# Patient Record
Sex: Female | Born: 1975 | Race: White | Hispanic: No | Marital: Married | Smoking: Never smoker
Health system: Southern US, Community
[De-identification: ages and names within clinical notes are randomized; demographics above are authoritative.]

## PROBLEM LIST (undated history)

## (undated) DIAGNOSIS — J45909 Unspecified asthma, uncomplicated: Secondary | ICD-10-CM

## (undated) DIAGNOSIS — R0600 Dyspnea, unspecified: Secondary | ICD-10-CM

## (undated) DIAGNOSIS — Z789 Other specified health status: Secondary | ICD-10-CM

## (undated) HISTORY — PX: NO PAST SURGERIES: SHX2092

## (undated) HISTORY — DX: Other specified health status: Z78.9

---

## 2013-03-02 DIAGNOSIS — Z1371 Encounter for nonprocreative screening for genetic disease carrier status: Secondary | ICD-10-CM

## 2013-03-02 HISTORY — DX: Encounter for nonprocreative screening for genetic disease carrier status: Z13.71

## 2013-09-29 LAB — HM PAP SMEAR: HM PAP: NORMAL

## 2013-10-11 ENCOUNTER — Ambulatory Visit: Payer: Self-pay | Admitting: Nurse Practitioner

## 2014-04-16 ENCOUNTER — Ambulatory Visit: Payer: Self-pay | Admitting: Nurse Practitioner

## 2014-09-28 ENCOUNTER — Other Ambulatory Visit: Payer: Self-pay | Admitting: Nurse Practitioner

## 2014-09-28 DIAGNOSIS — N63 Unspecified lump in unspecified breast: Secondary | ICD-10-CM

## 2014-10-08 ENCOUNTER — Other Ambulatory Visit: Payer: Self-pay

## 2014-10-08 ENCOUNTER — Ambulatory Visit: Payer: Self-pay

## 2014-10-17 ENCOUNTER — Ambulatory Visit: Payer: Self-pay

## 2014-10-17 ENCOUNTER — Ambulatory Visit
Admission: RE | Admit: 2014-10-17 | Discharge: 2014-10-17 | Disposition: A | Discharge status: Home / Self Care | Payer: BC Managed Care – PPO | Source: Ambulatory Visit | Attending: Nurse Practitioner | Admitting: Nurse Practitioner

## 2014-10-17 DIAGNOSIS — N63 Unspecified lump in unspecified breast: Secondary | ICD-10-CM

## 2015-10-01 ENCOUNTER — Other Ambulatory Visit: Payer: Self-pay | Admitting: Obstetrics and Gynecology

## 2015-10-21 ENCOUNTER — Other Ambulatory Visit: Payer: Self-pay | Admitting: Obstetrics and Gynecology

## 2015-10-21 DIAGNOSIS — Z1231 Encounter for screening mammogram for malignant neoplasm of breast: Secondary | ICD-10-CM

## 2015-11-12 ENCOUNTER — Ambulatory Visit
Admission: RE | Admit: 2015-11-12 | Discharge: 2015-11-12 | Disposition: A | Discharge status: Home / Self Care | Payer: BC Managed Care – PPO | Source: Ambulatory Visit | Attending: Obstetrics and Gynecology | Admitting: Obstetrics and Gynecology

## 2015-11-12 ENCOUNTER — Other Ambulatory Visit: Payer: Self-pay | Admitting: Obstetrics and Gynecology

## 2015-11-12 DIAGNOSIS — Z1231 Encounter for screening mammogram for malignant neoplasm of breast: Secondary | ICD-10-CM

## 2017-04-02 ENCOUNTER — Other Ambulatory Visit: Payer: Self-pay | Admitting: Obstetrics and Gynecology

## 2017-04-02 ENCOUNTER — Encounter: Payer: Self-pay | Admitting: Obstetrics and Gynecology

## 2017-04-02 ENCOUNTER — Ambulatory Visit (INDEPENDENT_AMBULATORY_CARE_PROVIDER_SITE_OTHER): Payer: BC Managed Care – PPO | Admitting: Obstetrics and Gynecology

## 2017-04-02 VITALS — BP 128/88 | Ht 63.0 in | Wt 270.0 lb

## 2017-04-02 DIAGNOSIS — Z3041 Encounter for surveillance of contraceptive pills: Secondary | ICD-10-CM | POA: Diagnosis not present

## 2017-04-02 DIAGNOSIS — Z124 Encounter for screening for malignant neoplasm of cervix: Secondary | ICD-10-CM

## 2017-04-02 DIAGNOSIS — N921 Excessive and frequent menstruation with irregular cycle: Secondary | ICD-10-CM

## 2017-04-02 DIAGNOSIS — Z1231 Encounter for screening mammogram for malignant neoplasm of breast: Secondary | ICD-10-CM

## 2017-04-02 DIAGNOSIS — Z01419 Encounter for gynecological examination (general) (routine) without abnormal findings: Secondary | ICD-10-CM | POA: Diagnosis not present

## 2017-04-02 DIAGNOSIS — Z1331 Encounter for screening for depression: Secondary | ICD-10-CM | POA: Diagnosis not present

## 2017-04-02 DIAGNOSIS — Z1339 Encounter for screening examination for other mental health and behavioral disorders: Secondary | ICD-10-CM | POA: Diagnosis not present

## 2017-04-02 LAB — HM PAP SMEAR: HM Pap smear: NORMAL

## 2017-04-02 MED ORDER — NORETHIN-ETH ESTRAD-FE BIPHAS 1 MG-10 MCG / 10 MCG PO TABS: 1.0000 | ORAL_TABLET | Freq: Every day | ORAL | 4 refills | Status: DC

## 2017-04-02 NOTE — Progress Notes (Signed)
Gynecology Annual Exam   PCP: Patient, No Pcp Per   Chief Complaint  Patient presents with  . Annual Exam   History of Present Illness:  [name] is a 42 y.o. G1P1001 who LMP was Patient's last menstrual period was 03/19/2017., presents today for her annual examination.  Her menses are rare while taking lo loestrin.  She is out of the medication and her symptoms are starting to return.   She is single partner, contraception - OCP (estrogen/progesterone). She does not have vaginal dryness.  Last Pap: 09/29/2013  Results were: no abnormalities /neg HPV DNA.  Hx of STDs: none  Last mammogram: 11/2015  Results were: normal--routine follow-up in 12 months There is no FH of breast cancer. There is no FH of ovarian cancer. The patient does not do self-breast exams.  Colonoscopy: n/a DEXA: has not been screened for osteoporosis  Tobacco use: The patient denies current or previous tobacco use. Alcohol use: social drinker Exercise: not active  The patient wears seatbelts: yes.     Periods much improved with lo lo estrin, but she has run out.  History of abnormal uterine bleeding (menorrhagia).  This has been previously worked up by me. She would like to continue taking lo loestrin for control of these symptoms, which has been working well.   Past Medical History: Denies  Past Surgical History: Denies  Medications   Denies   Allergies: No Known Allergies  Obstetric History: G1P1001  Family History  Problem Relation Age of Onset  . Skin cancer Father   . Colon cancer Father   . Lung cancer Paternal Grandmother   . Breast cancer Paternal Grandmother     Social History   Socioeconomic History  . Marital status: Married    Spouse name: Not on file  . Number of children: Not on file  . Years of education: Not on file  . Highest education level: Not on file  Social Needs  . Financial resource strain: Not on file  . Food insecurity - worry: Not on file  .  Food insecurity - inability: Not on file  . Transportation needs - medical: Not on file  . Transportation needs - non-medical: Not on file  Occupational History  . Not on file  Tobacco Use  . Smoking status: Never Smoker  . Smokeless tobacco: Never Used  Substance and Sexual Activity  . Alcohol use: Yes  . Drug use: No  . Sexual activity: Yes    Birth control/protection: None  Other Topics Concern  . Not on file  Social History Narrative  . Not on file    Review of Systems  Constitutional: Negative.   HENT: Negative.   Eyes: Negative.   Respiratory: Negative.   Cardiovascular: Negative.   Gastrointestinal: Negative.   Genitourinary: Negative.   Musculoskeletal: Negative.   Skin: Negative.   Neurological: Negative.   Psychiatric/Behavioral: Negative.      Physical Exam BP 128/88   Ht 5\' 3"  (1.6 m)   Wt 270 lb (122.5 kg)   LMP 03/19/2017   BMI 47.83 kg/m   Physical Exam  Constitutional: She is oriented to person, place, and time. She appears well-developed and well-nourished. No distress.  Genitourinary: Uterus normal. Pelvic exam was performed with patient supine. There is no rash, tenderness, lesion or injury on the right labia. There is no rash, tenderness, lesion or injury on the left labia. No erythema, tenderness or bleeding in the vagina. No signs of injury around the  vagina. No vaginal discharge found. Right adnexum does not display mass, does not display tenderness and does not display fullness. Left adnexum does not display mass, does not display tenderness and does not display fullness. Cervix does not exhibit motion tenderness, lesion, discharge or polyp.   Uterus is mobile and anteverted. Uterus is not enlarged, tender or exhibiting a mass.  HENT:  Head: Normocephalic and atraumatic.  Eyes: EOM are normal. No scleral icterus.  Neck: Normal range of motion. Neck supple. No thyromegaly present.  Cardiovascular: Normal rate and regular rhythm. Exam reveals no  gallop and no friction rub.  No murmur heard. Pulmonary/Chest: Effort normal and breath sounds normal. No respiratory distress. She has no wheezes. She has no rales. Right breast exhibits no inverted nipple, no mass, no nipple discharge, no skin change and no tenderness. Left breast exhibits no inverted nipple, no mass, no nipple discharge, no skin change and no tenderness.  Abdominal: Soft. Bowel sounds are normal. She exhibits no distension and no mass. There is no tenderness. There is no rebound and no guarding.  Musculoskeletal: Normal range of motion. She exhibits no edema or tenderness.  Lymphadenopathy:    She has no cervical adenopathy.       Right: No inguinal adenopathy present.       Left: No inguinal adenopathy present.  Neurological: She is alert and oriented to person, place, and time. No cranial nerve deficit.  Skin: Skin is warm and dry. No rash noted. No erythema.  Psychiatric: She has a normal mood and affect. Her behavior is normal. Judgment normal.    Female chaperone present for pelvic and breast  portions of the physical exam  Results: AUDIT Questionnaire (screen for alcoholism): 4 PHQ-9: 3  Assessment: 42 y.o. G62P1001 female here for routine gynecologic examination.  Plan: Problem List Items Addressed This Visit    None    Visit Diagnoses    Women's annual routine gynecological examination    -  Primary   Relevant Medications   Norethindrone-Ethinyl Estradiol-Fe Biphas (LO LOESTRIN FE) 1 MG-10 MCG / 10 MCG tablet   Other Relevant Orders   IGP, Aptima HPV, rfx 16/18,45   Screening for depression       Screening for alcoholism       Pap smear for cervical cancer screening       Relevant Orders   IGP, Aptima HPV, rfx 16/18,45   Menorrhagia with irregular cycle       Relevant Medications   Norethindrone-Ethinyl Estradiol-Fe Biphas (LO LOESTRIN FE) 1 MG-10 MCG / 10 MCG tablet   Encounter for surveillance of contraceptive pills       Relevant Medications    Norethindrone-Ethinyl Estradiol-Fe Biphas (LO LOESTRIN FE) 1 MG-10 MCG / 10 MCG tablet     Screening: -- Blood pressure screen normal -- Colonoscopy - not due -- Mammogram - due. Patient to call [facility] to arrange. She understands that it is her responsibility to arrange this. -- Weight screening: obese: discussed management options, including lifestyle, dietary, and exercise. -- Depression screening negative (PHQ-9) -- Nutrition: normal -- cholesterol screening: per PCP -- osteoporosis screening: not due -- tobacco screening: not using -- alcohol screening: AUDIT questionnaire indicates low-risk usage. -- family history of breast cancer screening: done. not at high risk. -- no evidence of domestic violence or intimate partner violence. -- STD screening: gonorrhea/chlamydia NAAT collected -- pap smear collected per ASCCP guidelines -- flu vaccine declines -- HPV vaccination series: not eligilbe  Prentice Docker, MD 04/02/2017  2:09 PM

## 2017-04-06 ENCOUNTER — Ambulatory Visit
Admission: RE | Admit: 2017-04-06 | Discharge: 2017-04-06 | Disposition: A | Discharge status: Home / Self Care | Payer: BC Managed Care – PPO | Source: Ambulatory Visit | Attending: Obstetrics and Gynecology | Admitting: Obstetrics and Gynecology

## 2017-04-06 DIAGNOSIS — Z1231 Encounter for screening mammogram for malignant neoplasm of breast: Secondary | ICD-10-CM | POA: Diagnosis not present

## 2017-04-06 LAB — IGP, APTIMA HPV, RFX 16/18,45
HPV APTIMA: NEGATIVE
PAP SMEAR COMMENT: 0

## 2017-07-05 ENCOUNTER — Encounter: Payer: Self-pay | Admitting: Obstetrics and Gynecology

## 2017-07-13 ENCOUNTER — Encounter: Payer: Self-pay | Admitting: Obstetrics and Gynecology

## 2017-07-20 ENCOUNTER — Ambulatory Visit (INDEPENDENT_AMBULATORY_CARE_PROVIDER_SITE_OTHER): Payer: BC Managed Care – PPO | Admitting: Obstetrics and Gynecology

## 2017-07-20 ENCOUNTER — Encounter: Payer: Self-pay | Admitting: Obstetrics and Gynecology

## 2017-07-20 VITALS — BP 116/78 | HR 89 | Ht 62.0 in | Wt 268.0 lb

## 2017-07-20 DIAGNOSIS — N921 Excessive and frequent menstruation with irregular cycle: Secondary | ICD-10-CM

## 2017-07-20 NOTE — Progress Notes (Signed)
Obstetrics & Gynecology Office Visit   Chief Complaint  Patient presents with  . Metrorrhagia    Cramping 5 on pain scale   History of Present Illness: 42 y.o. G47P1001 female presents for irregular bleeding. Below is the list of dates and syptoms. 06/08/17 and 06/11/17 - abdomen pain in evening, lasted about 1 hour, soaked in bath, pain would come and go.  Pain was on left lower abdomen and right lower back.  06/19/17 - started period. Lasted 5 days, lots of clots. She was changing a pad 2-3 times per days per day. No pain associated with the bleeding.  Just regular menstrual cramps.  06/30/17- started antibiotics for UTI (symptoms: dark urine, pressure in abdomen, lower back pain) 07/01/17 - started period, lasted 5 days, lots of clots.  Again, regular menstrual cramping.  07/10/17 - lower back pain, cramps 07/11/17 - started spotting, lasted 5 days, dark brown, some clots  Ongoing symptoms: exhaustion, irritability.  Sometimes the back pain is on both sides.   Prior to these symptoms she was having very little in the way of bleeding. She has been on lo loestrin.  She has been taking lo loestrin since 11/2015 and has been having very little in the way of symptoms.  She had been off the lo loestrin for 3 months prior to February.  She has had no weight changes. She has no bloating (apart from when she had the UTI).  Denies changes in bowel habits.  She denies early satiety.  Denies blood in stool.   Past Medical History: denies  Past Surgical History: denies  Gynecologic History: see bove  Obstetric History: G1P1001, s/p SVD   Family History  Problem Relation Age of Onset  . Skin cancer Father   . Colon cancer Father   . Lung cancer Paternal Grandmother   . Breast cancer Paternal Grandmother 52  . Breast cancer Maternal Aunt        mat great aunt    Social History   Socioeconomic History  . Marital status: Married    Spouse name: Not on file  . Number of children: Not on file  .  Years of education: Not on file  . Highest education level: Not on file  Occupational History  . Not on file  Social Needs  . Financial resource strain: Not on file  . Food insecurity:    Worry: Not on file    Inability: Not on file  . Transportation needs:    Medical: Not on file    Non-medical: Not on file  Tobacco Use  . Smoking status: Never Smoker  . Smokeless tobacco: Never Used  Substance and Sexual Activity  . Alcohol use: Yes  . Drug use: No  . Sexual activity: Yes    Birth control/protection: None  Lifestyle  . Physical activity:    Days per week: Not on file    Minutes per session: Not on file  . Stress: Not on file  Relationships  . Social connections:    Talks on phone: Not on file    Gets together: Not on file    Attends religious service: Not on file    Active member of club or organization: Not on file    Attends meetings of clubs or organizations: Not on file    Relationship status: Not on file  . Intimate partner violence:    Fear of current or ex partner: Not on file    Emotionally abused: Not on file  Physically abused: Not on file    Forced sexual activity: Not on file  Other Topics Concern  . Not on file  Social History Narrative  . Not on file   Allergies: No Known Allergies  Prior to Admission medications   Medication Sig Start Date End Date Taking? Authorizing Provider  albuterol (VENTOLIN HFA) 108 (90 Base) MCG/ACT inhaler 2 inhaled puffs every 4 hours as needed 08/27/08  Yes [provider]  cetirizine (ZYRTEC) 10 MG tablet Take by mouth.   Yes [provider]  ibuprofen (ADVIL,MOTRIN) 200 MG tablet Take by mouth.   Yes [provider]  predniSONE (DELTASONE) 50 MG tablet Take by mouth. 05/20/15  Yes [provider]  Norethindrone-Ethinyl Estradiol-Fe Biphas (LO LOESTRIN FE) 1 MG-10 MCG / 10 MCG tablet Take 1 tablet by mouth daily. 04/02/17 06/25/17  Will Bonnet, MD    Review of Systems    Constitutional: Positive for malaise/fatigue. Negative for chills, diaphoresis, fever and weight loss.  HENT: Negative.   Eyes: Negative.   Respiratory: Negative.   Cardiovascular: Negative.   Gastrointestinal: Negative.   Genitourinary: Negative.        Otherwise, see HPI  Musculoskeletal: Negative.   Skin: Negative.   Neurological: Negative.   Psychiatric/Behavioral: Negative.      Physical Exam BP 116/78 (BP Location: Left Arm, Patient Position: Sitting, Cuff Size: Large)   Pulse 89   Ht 5\' 2"  (1.575 m)   Wt 268 lb (121.6 kg)   BMI 49.02 kg/m  No LMP recorded. Physical Exam  Constitutional: She is oriented to person, place, and time. She appears well-developed and well-nourished. No distress.  Genitourinary: Vagina normal and uterus normal. Pelvic exam was performed with patient supine. There is no rash, tenderness or lesion on the right labia. There is no rash, tenderness or lesion on the left labia. Right adnexum does not display mass, does not display tenderness and does not display fullness. Left adnexum does not display mass, does not display tenderness and does not display fullness. Cervix does not exhibit motion tenderness, lesion, discharge or polyp.  Genitourinary Comments: Bimanual limited by body habitus  HENT:  Head: Normocephalic and atraumatic.  Eyes: EOM are normal. No scleral icterus.  Neck: Normal range of motion. Neck supple. No thyromegaly present.  Cardiovascular: Normal rate and regular rhythm.  Pulmonary/Chest: Effort normal and breath sounds normal. No respiratory distress. She has no wheezes. She has no rales.  Abdominal: Soft. Bowel sounds are normal. She exhibits no distension and no mass. There is no tenderness. There is no rebound and no guarding.  Musculoskeletal: Normal range of motion. She exhibits no edema.  Lymphadenopathy:    She has no cervical adenopathy.  Neurological: She is alert and oriented to person, place, and time. No cranial nerve  deficit.  Skin: Skin is warm and dry. No erythema.  Psychiatric: She has a normal mood and affect. Her behavior is normal. Judgment normal.    Female chaperone present for pelvic and breast  portions of the physical exam  Assessment: 42 y.o. G6P1001 female here for  1. Menorrhagia with irregular cycle      Plan: Problem List Items Addressed This Visit    None    Visit Diagnoses    Menorrhagia with irregular cycle    -  Primary   Relevant Orders   US PELVIS TRANSVANGINAL NON-OB (TV ONLY)   TSH + free T4   Prolactin   Hgb A1c w/o eAG  This is an exacerbation of a problem that was previously under control with combined OCPs (lo loestrin). Will get pelvic ultrasound and the above labs.  Consideration of endometrial biopsy, also, pending ultrasound.    20 minutes spent in face to face discussion with > 50% spent in counseling,management, and coordination of care of her menorrhagia with regular cycles.   Prentice Docker, MD 07/20/2017 12:56 PM

## 2017-07-21 LAB — TSH+FREE T4
FREE T4: 1.24 ng/dL (ref 0.82–1.77)
TSH: 2.72 u[IU]/mL (ref 0.450–4.500)

## 2017-07-21 LAB — PROLACTIN: Prolactin: 15.4 ng/mL (ref 4.8–23.3)

## 2017-07-21 LAB — HGB A1C W/O EAG: Hgb A1c MFr Bld: 5.3 % (ref 4.8–5.6)

## 2017-07-22 ENCOUNTER — Telehealth: Payer: Self-pay

## 2017-07-22 NOTE — Telephone Encounter (Signed)
Pt inquiring about 07/20/17 lab results. Cb#[phone]

## 2017-07-22 NOTE — Telephone Encounter (Signed)
Notified pt SDJ not in office today. Will be in office tomorrow. Msg to [facility]

## 2017-07-23 NOTE — Telephone Encounter (Signed)
Please let patient know that I released these results just now. Everything was normal. I will see her at her follow up and we'll move forward based on those results. Thank you

## 2017-07-23 NOTE — Telephone Encounter (Signed)
Pt aware via vm 

## 2017-07-30 ENCOUNTER — Encounter: Payer: Self-pay | Admitting: Obstetrics and Gynecology

## 2017-07-30 ENCOUNTER — Ambulatory Visit (INDEPENDENT_AMBULATORY_CARE_PROVIDER_SITE_OTHER): Payer: BC Managed Care – PPO | Admitting: Obstetrics and Gynecology

## 2017-07-30 ENCOUNTER — Ambulatory Visit (INDEPENDENT_AMBULATORY_CARE_PROVIDER_SITE_OTHER): Payer: BC Managed Care – PPO

## 2017-07-30 VITALS — BP 124/82 | HR 80 | Ht 62.0 in | Wt 273.0 lb

## 2017-07-30 DIAGNOSIS — N921 Excessive and frequent menstruation with irregular cycle: Secondary | ICD-10-CM

## 2017-07-30 NOTE — Progress Notes (Signed)
Gynecology Ultrasound Follow Up   Chief Complaint  Patient presents with  . Follow-up    u/s   Abnormal uterine bleeding  History of Present Illness: Patient is a 43 y.o. female who presents today for ultrasound evaluation of the above.  Ultrasound demonstrates the following findings Adnexa: cyst seen on left ovary (appear to be follicles)  Uterus: anteverted with endometrial stripe  4.9 mm Additional: no other findings  Past Medical History: denies  Past Surgical History: denies  Family History  Problem Relation Age of Onset  . Skin cancer Father   . Colon cancer Father   . Lung cancer Paternal Grandmother   . Breast cancer Paternal Grandmother 64  . Breast cancer Maternal Aunt        mat great aunt    Social History   Socioeconomic History  . Marital status: Married    Spouse name: Not on file  . Number of children: Not on file  . Years of education: Not on file  . Highest education level: Not on file  Occupational History  . Not on file  Social Needs  . Financial resource strain: Not on file  . Food insecurity:    Worry: Not on file    Inability: Not on file  . Transportation needs:    Medical: Not on file    Non-medical: Not on file  Tobacco Use  . Smoking status: Never Smoker  . Smokeless tobacco: Never Used  Substance and Sexual Activity  . Alcohol use: Yes  . Drug use: No  . Sexual activity: Yes    Birth control/protection: None  Lifestyle  . Physical activity:    Days per week: Not on file    Minutes per session: Not on file  . Stress: Not on file  Relationships  . Social connections:    Talks on phone: Not on file    Gets together: Not on file    Attends religious service: Not on file    Active member of club or organization: Not on file    Attends meetings of clubs or organizations: Not on file    Relationship status: Not on file  . Intimate partner violence:    Fear of current or ex partner: Not on file    Emotionally abused: Not  on file    Physically abused: Not on file    Forced sexual activity: Not on file  Other Topics Concern  . Not on file  Social History Narrative  . Not on file    Allergies  Allergen Reactions  . Amoxicillin Hives and Rash    Prior to Admission medications   Medication Sig Start Date End Date Taking? Authorizing Provider  cetirizine (ZYRTEC) 10 MG tablet Take by mouth.   Yes [provider]  ibuprofen (ADVIL,MOTRIN) 200 MG tablet Take by mouth.   Yes [provider]  albuterol (VENTOLIN HFA) 108 (90 Base) MCG/ACT inhaler 2 inhaled puffs every 4 hours as needed 08/27/08   [provider]  Norethindrone-Ethinyl Estradiol-Fe Biphas (LO LOESTRIN FE) 1 MG-10 MCG / 10 MCG tablet Take 1 tablet by mouth daily. 04/02/17 06/25/17  Will Bonnet, MD  predniSONE (DELTASONE) 50 MG tablet Take by mouth. 05/20/15   [provider]    Physical Exam BP 124/82   Pulse 80   Ht 5\' 2"  (1.575 m)   Wt 273 lb (123.8 kg)   BMI 49.93 kg/m    General: NAD HEENT: normocephalic, anicteric Pulmonary: No increased work  of breathing Extremities: no edema, erythema, or tenderness Neurologic: Grossly intact, normal gait Psychiatric: mood appropriate, affect full  US Pelvis Transvanginal Non-ob (tv Only)  Result Date: 07/30/2017 Patient Name: Jenny Wade DOB: 1975/12/17 MRN: [id number] ULTRASOUND REPORT Location: [facility] Date of Service: 07/30/2017 Indications: Abnormal Uterine Bleeding Findings: The uterus is anteverted and measures 8.4x5.02x4.12cm. Echo texture is homogenous without evidence of focal masses. The Endometrium measures 4.93 mm. Right Ovary measures 2.48x2.86x1.3 cm. It is normal in appearance. Left Ovary measures 3.07x3.35x2.47 cm. It is normal in appearance. , but has two simple cysts = 22.7 mm x 22 mm and 19x42mm (possib. of preovulatory or dominant  follicles vs others) Survey of the adnexa demonstrates no adnexal masses. There is no free fluid in  the cul de sac. Impression: 1. Lt ovary has two simple cysts = 22.7 x 22 mm and 19 x 60mm (possib. of preovulatory or dominant follicles vs others) Recommendations: 1.Clinical correlation with the patient's History and Physical Exam. Abeer Alsammarraie RDMS The ultrasound images and findings were reviewed by me and I agree with the above report. Prentice Docker, MD, [facility], [facility] 07/30/2017 4:34 PM    Assessment: 42 y.o. G1P1001  1. Menorrhagia with irregular cycle      Plan: Problem List Items Addressed This Visit    None    Visit Diagnoses    Menorrhagia with irregular cycle    -  Primary     Patient reassured for today given thin endometrial stripe and otherwise normal findings. Her endometrial stripe may be too thin. So, will try Taytulla, which has a higher dose of estrogen (20 mcg).  If this works better, will switch her over with a prescription. If her bleeding persists, will perform endometrial biopsy.  15 minutes spent in face to face discussion with > 50% spent in counseling,management, and coordination of care of her menorrhagia with irregular cycle.   Prentice Docker, MD, [facility], [facility] 07/30/2017 5:50 PM

## 2017-08-12 ENCOUNTER — Other Ambulatory Visit: Payer: BC Managed Care – PPO

## 2017-08-12 ENCOUNTER — Ambulatory Visit: Payer: BC Managed Care – PPO | Admitting: Obstetrics and Gynecology

## 2018-03-14 ENCOUNTER — Other Ambulatory Visit: Payer: Self-pay | Admitting: Obstetrics and Gynecology

## 2018-03-14 DIAGNOSIS — Z1231 Encounter for screening mammogram for malignant neoplasm of breast: Secondary | ICD-10-CM

## 2018-04-11 ENCOUNTER — Ambulatory Visit
Admission: RE | Admit: 2018-04-11 | Discharge: 2018-04-11 | Disposition: A | Discharge status: Home / Self Care | Payer: BC Managed Care – PPO | Source: Ambulatory Visit | Attending: Obstetrics and Gynecology | Admitting: Obstetrics and Gynecology

## 2018-04-11 DIAGNOSIS — Z1231 Encounter for screening mammogram for malignant neoplasm of breast: Secondary | ICD-10-CM | POA: Insufficient documentation

## 2019-02-10 ENCOUNTER — Ambulatory Visit
Admission: EM | Admit: 2019-02-10 | Discharge: 2019-02-10 | Disposition: A | Discharge status: Home / Self Care | Payer: BC Managed Care – PPO | Attending: Family Medicine | Admitting: Family Medicine

## 2019-02-10 ENCOUNTER — Other Ambulatory Visit: Payer: Self-pay

## 2019-02-10 ENCOUNTER — Encounter: Payer: Self-pay | Admitting: Emergency Medicine

## 2019-02-10 DIAGNOSIS — M545 Low back pain, unspecified: Secondary | ICD-10-CM

## 2019-02-10 DIAGNOSIS — R1013 Epigastric pain: Secondary | ICD-10-CM | POA: Diagnosis not present

## 2019-02-10 LAB — URINALYSIS, COMPLETE (UACMP) WITH MICROSCOPIC

## 2019-02-10 MED ORDER — PANTOPRAZOLE SODIUM 40 MG PO TBEC: 40.0000 mg | DELAYED_RELEASE_TABLET | Freq: Every day | ORAL | 0 refills | Status: DC

## 2019-02-10 MED ORDER — TRAMADOL HCL 50 MG PO TABS: 50.0000 mg | ORAL_TABLET | Freq: Three times a day (TID) | ORAL | 0 refills | Status: DC | PRN

## 2019-02-10 NOTE — ED Triage Notes (Signed)
Pt c/o lower back pain, lower abdominal pain and urinary retention. Started about 3 days ago but became present yesterday. She states she had a UTI a couple months ago and this feels very similar.

## 2019-02-10 NOTE — ED Provider Notes (Signed)
[facility]    CSN: [id number] Arrival date & time: 02/10/19  [id number]  History   Chief Complaint Chief Complaint  Patient presents with  . Back Pain    APPT  . Urinary Retention   HPI  43 year old female presents with suspected UTI.  Patient reports that her symptoms started approximately 3 days ago.  She reports low back pain and lower abdominal pain.  Patient states that she has recently had a menstrual cycle and attributed that to her symptoms.  However, she subsequently felt as if she was not putting out much urine.  She states that this is how UTIs have presented previously.  She is concerned that she has UTI.  No fever.  No flank pain.  No relieving factors.  Rates her pain as 8/10 in severity.  No other complaints.  PMH, Surgical Hx, Family Hx, Social History reviewed and updated as below.  PMH: Asthma, Allergy, Morbid obesity  Surgical Hx: Tympanostomy tube placement  Home Medications    Prior to Admission medications   Medication Sig Start Date End Date Taking? Authorizing Provider  albuterol (VENTOLIN HFA) 108 (90 Base) MCG/ACT inhaler 2 inhaled puffs every 4 hours as needed 08/27/08  Yes [provider]  cetirizine (ZYRTEC) 10 MG tablet Take by mouth.   Yes [provider]  pantoprazole (PROTONIX) 40 MG tablet Take 1 tablet (40 mg total) by mouth daily. 02/10/19   Coral Spikes, DO  traMADol (ULTRAM) 50 MG tablet Take 1 tablet (50 mg total) by mouth every 8 (eight) hours as needed. 02/10/19   Coral Spikes, DO  Norethindrone-Ethinyl Estradiol-Fe Biphas (LO LOESTRIN FE) 1 MG-10 MCG / 10 MCG tablet Take 1 tablet by mouth daily. 04/02/17 02/10/19  Will Bonnet, MD    Family History Family History  Problem Relation Age of Onset  . Skin cancer Father   . Colon cancer Father   . Lung cancer Paternal Grandmother   . Breast cancer Paternal Grandmother 69  . Breast cancer Maternal Aunt        mat great aunt    Social History Social  History   Tobacco Use  . Smoking status: Never Smoker  . Smokeless tobacco: Never Used  Substance Use Topics  . Alcohol use: Yes  . Drug use: No     Allergies   Amoxicillin   Review of Systems Review of Systems  Gastrointestinal: Positive for abdominal pain.  Genitourinary: Negative for dysuria and frequency.  Musculoskeletal: Positive for back pain.   Physical Exam Triage Vital Signs ED Triage Vitals  Enc Vitals Group     BP 02/10/19 0937 124/80     Pulse Rate 02/10/19 0937 (!) 121     Resp 02/10/19 0937 18     Temp 02/10/19 0937 99.3 F (37.4 C)     Temp Source 02/10/19 0937 Oral     SpO2 02/10/19 0937 99 %     Weight 02/10/19 0934 243 lb (110.2 kg)     Height 02/10/19 0934 5\' 2"  (1.575 m)     Head Circumference --      Peak Flow --      Pain Score 02/10/19 0933 8     Pain Loc --      Pain Edu? --      Excl. in [place]? --    Updated Vital Signs BP 124/80 (BP Location: Left Arm)   Pulse (!) 121   Temp 99.3 F (37.4 C) (Oral)   Resp 18  Ht 5\' 2"  (1.575 m)   Wt 110.2 kg   LMP 02/07/2019   SpO2 99%   BMI 44.45 kg/m   Visual Acuity Right Eye Distance:   Left Eye Distance:   Bilateral Distance:    Right Eye Near:   Left Eye Near:    Bilateral Near:     Physical Exam Vitals and nursing note reviewed.  Constitutional:      General: She is not in acute distress.    Appearance: Normal appearance. She is obese. She is not ill-appearing.  HENT:     Head: Normocephalic and atraumatic.  Eyes:     General:        Right eye: No discharge.        Left eye: No discharge.     Conjunctiva/sclera: Conjunctivae normal.  Cardiovascular:     Rate and Rhythm: Regular rhythm. Tachycardia present.  Pulmonary:     Effort: Pulmonary effort is normal.     Breath sounds: Normal breath sounds. No wheezing, rhonchi or rales.  Abdominal:     Tenderness: There is no right CVA tenderness or left CVA tenderness.     Comments: Soft, nondistended.  Tender to palpation in the  epigastric region.  No lower abdominal tenderness.  No guarding or rebound.  Neurological:     Mental Status: She is alert.  Psychiatric:        Mood and Affect: Mood normal.        Behavior: Behavior normal.     UC Treatments / Results  Labs (all labs ordered are listed, but only abnormal results are displayed) Labs Reviewed  URINALYSIS, COMPLETE (UACMP) WITH MICROSCOPIC - Abnormal; Notable for the following components:      Result Value   Color, Urine BIOCHEMICALS MAY BE AFFECTED BY COLOR (*)    APPearance CLOUDY (*)    Glucose, UA   (*)    Value: TEST NOT REPORTED DUE TO COLOR INTERFERENCE OF URINE PIGMENT   Hgb urine dipstick   (*)    Value: TEST NOT REPORTED DUE TO COLOR INTERFERENCE OF URINE PIGMENT   Bilirubin Urine   (*)    Value: TEST NOT REPORTED DUE TO COLOR INTERFERENCE OF URINE PIGMENT   Ketones, ur   (*)    Value: TEST NOT REPORTED DUE TO COLOR INTERFERENCE OF URINE PIGMENT   Protein, ur   (*)    Value: TEST NOT REPORTED DUE TO COLOR INTERFERENCE OF URINE PIGMENT   Nitrite   (*)    Value: TEST NOT REPORTED DUE TO COLOR INTERFERENCE OF URINE PIGMENT   Leukocytes,Ua   (*)    Value: TEST NOT REPORTED DUE TO COLOR INTERFERENCE OF URINE PIGMENT   Bacteria, UA FEW (*)    All other components within normal limits  URINE CULTURE    EKG   Radiology No results found.  Procedures Procedures (including critical care time)  Medications Ordered in UC Medications - No data to display  Initial Impression / Assessment and Plan / UC Course  I have reviewed the triage vital signs and the nursing notes.  Pertinent labs & imaging results that were available during my care of the patient were reviewed by me and considered in my medical decision making (see chart for details).    43 year old female presents with concern for UTI.  No evidence of UTI on urine microscopy.  Sending culture.  Patient with epigastric tenderness on examination.  No lower abdominal tenderness.   Mild tachycardia.  Placing on  Protonix.  Tramadol as needed for pain.  Advised to call with concerns.  Supportive care.  Final Clinical Impressions(s) / UC Diagnoses   Final diagnoses:  Acute bilateral low back pain without sciatica  Epigastric abdominal pain     Discharge Instructions     No evidence of UTI.  Medications as directed.  I am culturing your urine to be sure.  Call with concerns.  Take care  Dr. Lacinda Axon    ED Prescriptions    Medication Sig Dispense Auth. Provider   traMADol (ULTRAM) 50 MG tablet Take 1 tablet (50 mg total) by mouth every 8 (eight) hours as needed. 15 tablet Jaelen Soth G, DO   pantoprazole (PROTONIX) 40 MG tablet Take 1 tablet (40 mg total) by mouth daily. 14 tablet Coral Spikes, DO     PDMP not reviewed this encounter.   Coral Spikes, [place] 02/10/19 1034

## 2019-02-10 NOTE — Discharge Instructions (Signed)
No evidence of UTI.  Medications as directed.  I am culturing your urine to be sure.  Call with concerns.  Take care  Dr. Lacinda Axon

## 2019-02-12 LAB — URINE CULTURE: Culture: >=100000 — AB

## 2019-02-13 ENCOUNTER — Telehealth: Payer: Self-pay

## 2019-02-13 NOTE — Telephone Encounter (Signed)
Pt calls in regarding her Pantoprazole which was prescribed on Friday. She reports diarrhea since taking it and wonders if she should discontinue or stay on the medication. It does seem to be helping her other sx.

## 2019-02-13 NOTE — Telephone Encounter (Signed)
Continue PPI. Likely not causing diarrhea.

## 2019-04-26 IMAGING — MG DIGITAL SCREENING BILATERAL MAMMOGRAM WITH TOMO AND CAD
8 series · 8 of 24 positions shown · non-contrast
Comparison: Previous exam(s).

CLINICAL DATA: Screening.

EXAM:
DIGITAL SCREENING BILATERAL MAMMOGRAM WITH TOMO AND CAD

[L CC synth-2D]
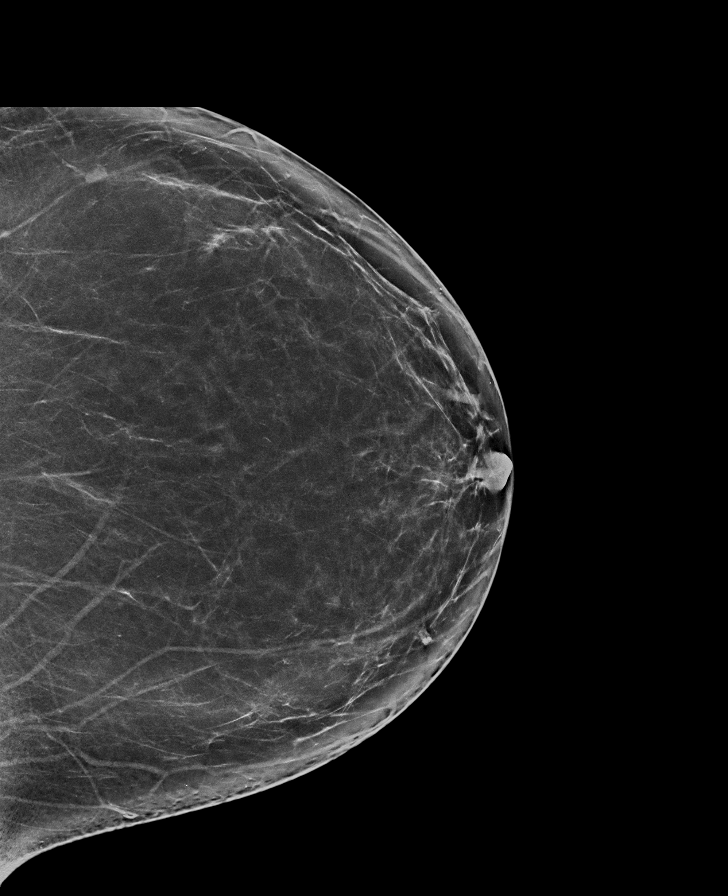

[R CC synth-2D]
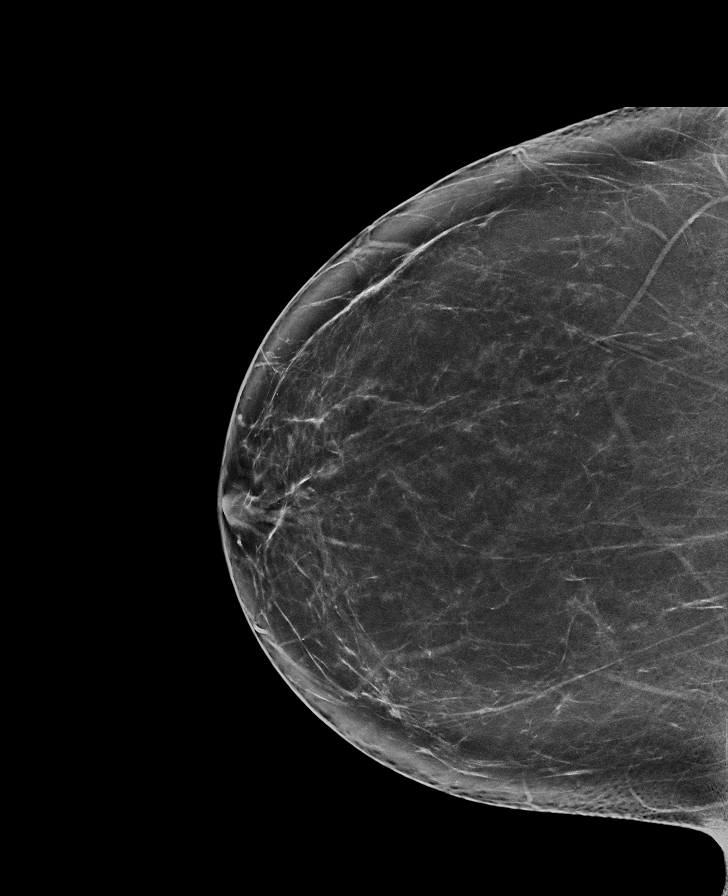

[R MLO synth-2D]
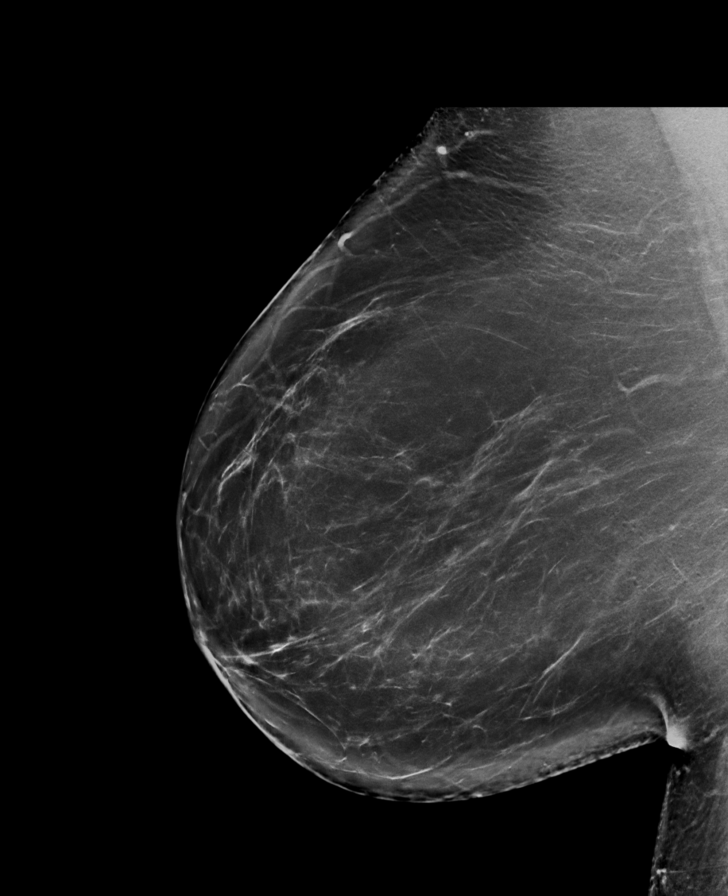

[L MLO synth-2D]
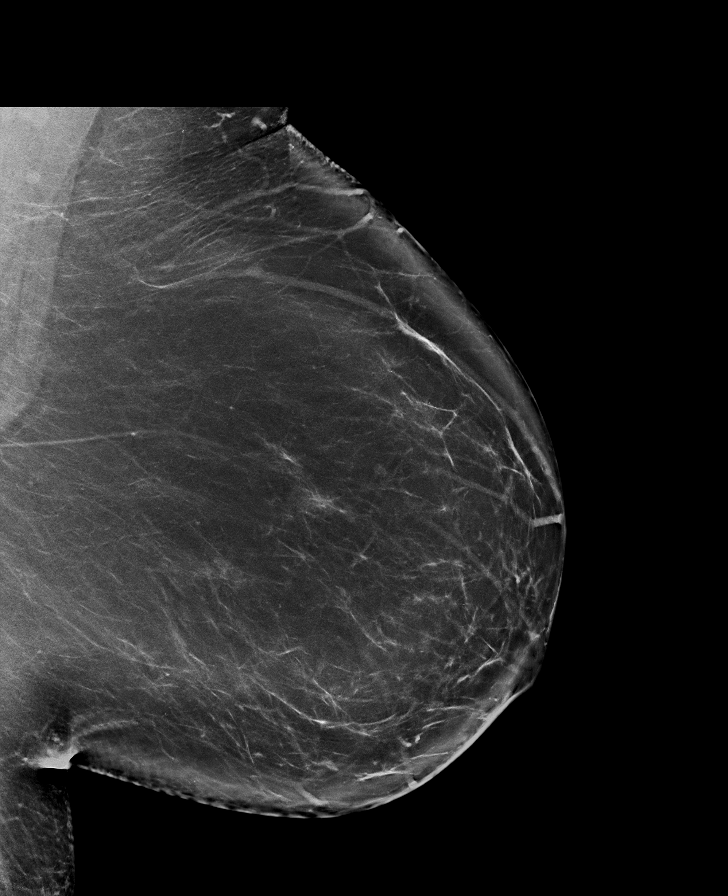

[R MLO tomo · tomo slice 53/105.0]
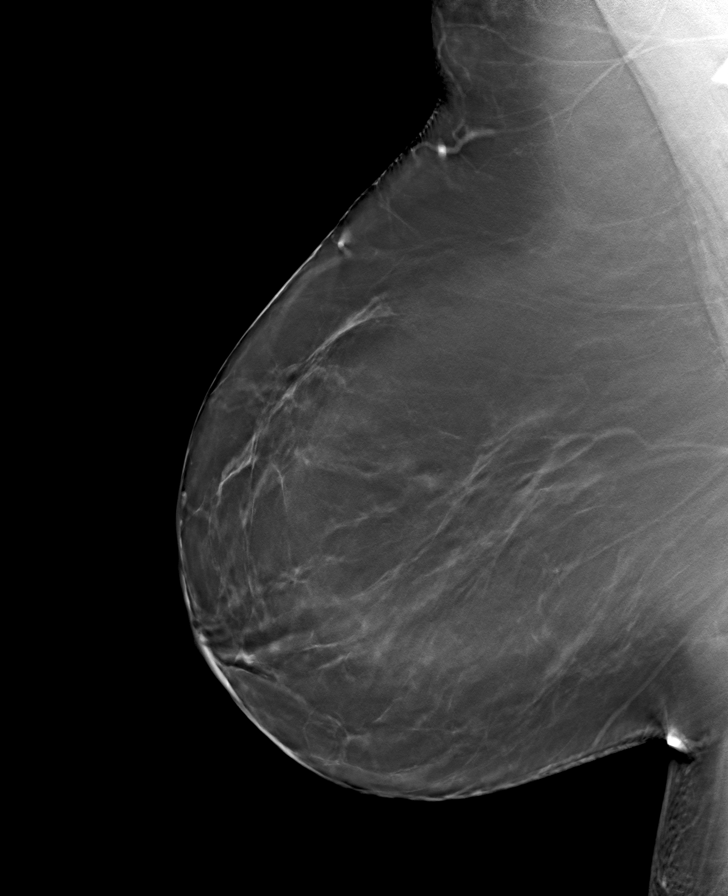

[L MLO tomo · tomo slice 51/102.0]
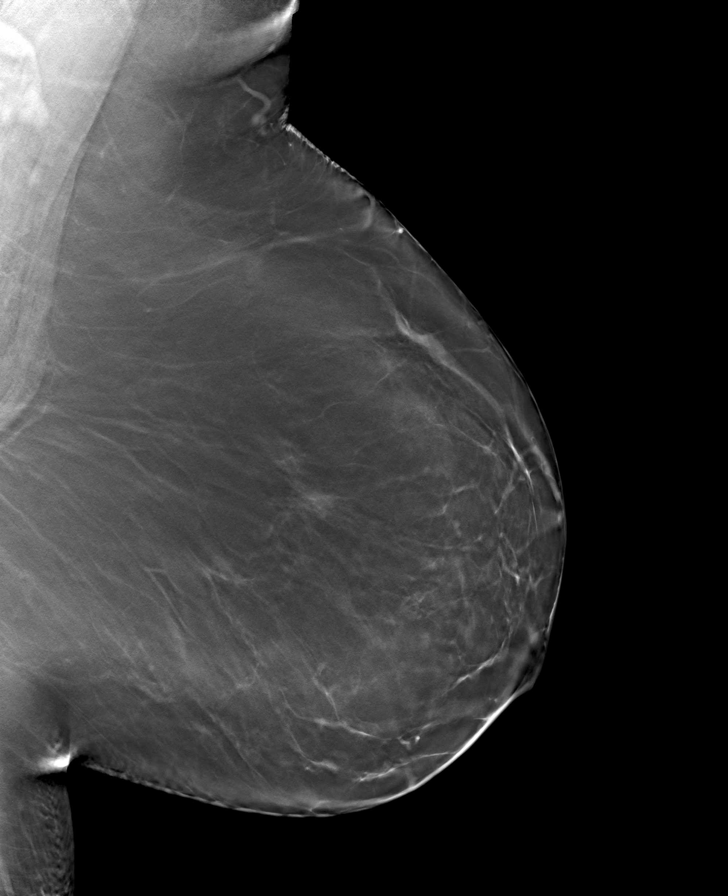

[R CC tomo · tomo slice 43/86.0]
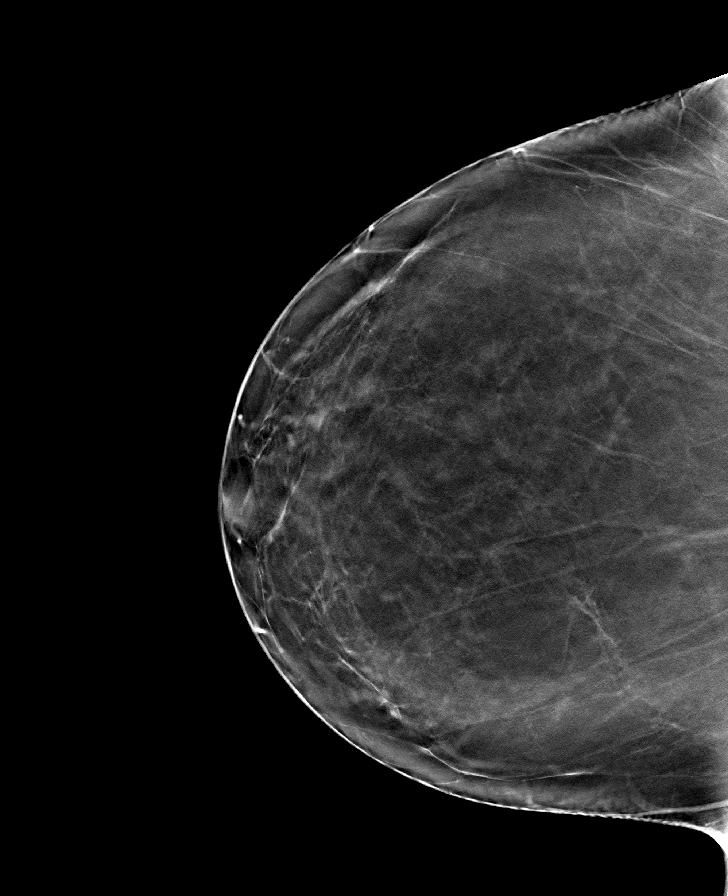

[L CC tomo · tomo slice 42/83.0]
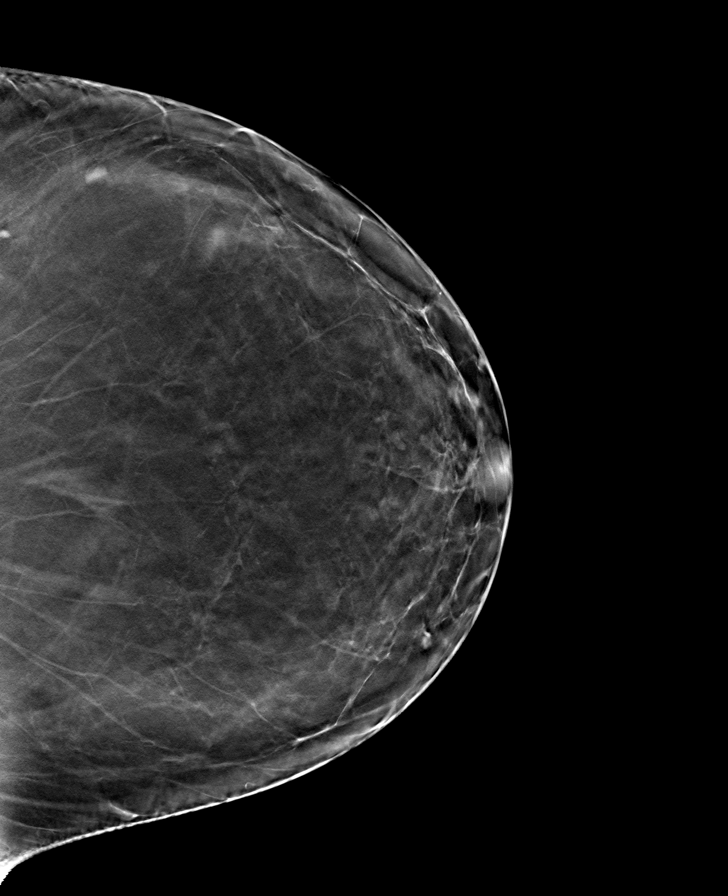

[8 of 24 positions shown; findings below may reference images not displayed]

ACR Breast Density Category b: There are scattered areas of
fibroglandular density.
FINDINGS: There are no findings suspicious for malignancy. Images were
processed with CAD.
IMPRESSION: No mammographic evidence of malignancy. A result letter of this
screening mammogram will be mailed directly to the patient.

RECOMMENDATION:
Screening mammogram in one year. (Code:[id number])

BI-RADS CATEGORY  1: Negative.

## 2019-09-12 ENCOUNTER — Other Ambulatory Visit: Payer: Self-pay | Admitting: Obstetrics and Gynecology

## 2019-09-12 DIAGNOSIS — Z1231 Encounter for screening mammogram for malignant neoplasm of breast: Secondary | ICD-10-CM

## 2019-10-13 ENCOUNTER — Encounter: Payer: Self-pay | Admitting: Obstetrics and Gynecology

## 2019-10-13 ENCOUNTER — Other Ambulatory Visit: Payer: Self-pay

## 2019-10-13 ENCOUNTER — Ambulatory Visit (INDEPENDENT_AMBULATORY_CARE_PROVIDER_SITE_OTHER): Payer: BC Managed Care – PPO | Admitting: Obstetrics and Gynecology

## 2019-10-13 VITALS — BP 126/84 | Ht 62.0 in | Wt 252.0 lb

## 2019-10-13 DIAGNOSIS — Z6841 Body Mass Index (BMI) 40.0 and over, adult: Secondary | ICD-10-CM

## 2019-10-13 DIAGNOSIS — Z1331 Encounter for screening for depression: Secondary | ICD-10-CM

## 2019-10-13 DIAGNOSIS — Z1339 Encounter for screening examination for other mental health and behavioral disorders: Secondary | ICD-10-CM

## 2019-10-13 DIAGNOSIS — Z01419 Encounter for gynecological examination (general) (routine) without abnormal findings: Secondary | ICD-10-CM

## 2019-10-13 NOTE — Patient Instructions (Signed)
Weight loss medication: Phentermine (like an amphetimine). Can only take for 3 months. Quicker weight loss Saxenda (injection): slower weight loss, but over a longer period of time.

## 2019-10-13 NOTE — Progress Notes (Signed)
Gynecology Annual Exam   PCP: Patient, No Pcp Per  Chief Complaint  Patient presents with  . Annual Exam   History of Present Illness:  [name] is a 44 y.o. G1P1001 who LMP was Patient's last menstrual period was 09/30/2019., presents today for her annual examination.  Her menses are regular every 26 days, lasting 5 day(s).  Dysmenorrhea severe, occurring premenstrually and first 1-2 days of flow. She does not have intermenstrual bleeding.  She does not have vasomotor sx.   She is sexually active.. She does not have vaginal dryness.  Last Pap: 04/02/2017  Results were: no abnormalities /neg HPV DNA negative.  Hx of STDs: none  Last mammogram: 1.5 years ago.   Results were: normal--routine follow-up in 12 months There is no FH of breast cancer. There is no FH of ovarian cancer. The patient does do self-breast exams.  Colonoscopy: never had DEXA: has not been screened for osteoporosis  Tobacco use: The patient denies current or previous tobacco use. Alcohol use: social drinker Exercise: walks a lot   The patient wears seatbelts: yes.     Past Medical History:  Diagnosis Date  . No known health problems     Past Surgical History:  Procedure Laterality Date  . NO PAST SURGERIES      Prior to Admission medications   Medication Sig Start Date End Date Taking? Authorizing Provider  pantoprazole (PROTONIX) 40 MG tablet Take 1 tablet (40 mg total) by mouth daily. 02/10/19   Coral Spikes, DO  traMADol (ULTRAM) 50 MG tablet Take 1 tablet (50 mg total) by mouth every 8 (eight) hours as needed. 02/10/19   Coral Spikes, DO  Norethindrone-Ethinyl Estradiol-Fe Biphas (LO LOESTRIN FE) 1 MG-10 MCG / 10 MCG tablet Take 1 tablet by mouth daily. 04/02/17 02/10/19  Will Bonnet, MD    Allergies  Allergen Reactions  . Amoxicillin Hives and Rash   Obstetric History: G1P1001  Family History  Problem Relation Age of Onset  . Skin cancer Father   . Colon cancer Father    . Lung cancer Paternal Grandmother   . Breast cancer Paternal Grandmother 54  . Breast cancer Maternal Aunt        mat great aunt    Social History   Socioeconomic History  . Marital status: Married    Spouse name: Not on file  . Number of children: Not on file  . Years of education: Not on file  . Highest education level: Not on file  Occupational History  . Not on file  Tobacco Use  . Smoking status: Never Smoker  . Smokeless tobacco: Never Used  Vaping Use  . Vaping Use: Never used  Substance and Sexual Activity  . Alcohol use: Yes  . Drug use: No  . Sexual activity: Yes    Birth control/protection: None  Other Topics Concern  . Not on file  Social History Narrative  . Not on file   Social Determinants of Health   Financial Resource Strain:   . Difficulty of Paying Living Expenses:   Food Insecurity:   . Worried About Charity fundraiser in the Last Year:   . Arboriculturist in the Last Year:   Transportation Needs:   . Film/video editor (Medical):   Marland Kitchen Lack of Transportation (Non-Medical):   Physical Activity:   . Days of Exercise per Week:   . Minutes of Exercise per Session:   Stress:   . Feeling  of Stress :   Social Connections:   . Frequency of Communication with Friends and Family:   . Frequency of Social Gatherings with Friends and Family:   . Attends Religious Services:   . Active Member of Clubs or Organizations:   . Attends Archivist Meetings:   Marland Kitchen Marital Status:   Intimate Partner Violence:   . Fear of Current or Ex-Partner:   . Emotionally Abused:   Marland Kitchen Physically Abused:   . Sexually Abused:     Review of Systems  Constitutional: Negative.   HENT: Negative.   Eyes: Negative.   Respiratory: Negative.   Cardiovascular: Negative.   Gastrointestinal: Negative.   Genitourinary: Negative.   Musculoskeletal: Negative.   Skin: Negative.   Neurological: Negative.   Psychiatric/Behavioral: Negative.      Physical Exam BP  126/84   Ht 5\' 2"  (1.575 m)   Wt 252 lb (114.3 kg)   LMP 09/30/2019   BMI 46.09 kg/m   Physical Exam Constitutional:      General: She is not in acute distress.    Appearance: Normal appearance. She is well-developed.  Genitourinary:     Pelvic exam was performed with patient in the lithotomy position.     Vulva, urethra, bladder and uterus normal.     No inguinal adenopathy present in the right or left side.    No signs of injury in the vagina.     No vaginal discharge, erythema, tenderness or bleeding.     No cervical motion tenderness, discharge, lesion or polyp.     Uterus is mobile.     Uterus is not enlarged or tender.     No uterine mass detected.    Uterus is anteverted.     No right or left adnexal mass present.     Right adnexa not tender or full.     Left adnexa not tender or full.  HENT:     Head: Normocephalic and atraumatic.  Eyes:     General: No scleral icterus.    Conjunctiva/sclera: Conjunctivae normal.  Neck:     Thyroid: No thyromegaly.  Cardiovascular:     Rate and Rhythm: Normal rate and regular rhythm.     Heart sounds: No murmur heard.  No friction rub. No gallop.   Pulmonary:     Effort: Pulmonary effort is normal. No respiratory distress.     Breath sounds: Normal breath sounds. No wheezing or rales.  Chest:     Breasts:        Right: No inverted nipple, mass, nipple discharge, skin change or tenderness.        Left: No inverted nipple, mass, nipple discharge, skin change or tenderness.  Abdominal:     General: Bowel sounds are normal. There is no distension.     Palpations: Abdomen is soft. There is no mass.     Tenderness: There is no abdominal tenderness. There is no guarding or rebound.  Musculoskeletal:        General: No swelling or tenderness. Normal range of motion.     Cervical back: Normal range of motion and neck supple.  Lymphadenopathy:     Cervical: No cervical adenopathy.     Lower Body: No right inguinal adenopathy. No left  inguinal adenopathy.  Neurological:     General: No focal deficit present.     Mental Status: She is alert and oriented to person, place, and time.     Cranial Nerves: No cranial nerve deficit.  Skin:    General: Skin is warm and dry.     Findings: No erythema or rash.  Psychiatric:        Mood and Affect: Mood normal.        Behavior: Behavior normal.        Judgment: Judgment normal.    Female chaperone present for pelvic and breast  portions of the physical exam  Results: AUDIT Questionnaire (screen for alcoholism): 3 PHQ-9: 4  Assessment: 44 y.o. G56P1001 female here for routine gynecologic examination.  Plan: Problem List Items Addressed This Visit    None    Visit Diagnoses    Women's annual routine gynecological examination    -  Primary   Screening for depression       Screening for alcoholism       Class 3 severe obesity due to excess calories without serious comorbidity with body mass index (BMI) of 40.0 to 44.9 in adult [facility])          Screening: -- Blood pressure screen normal -- Colonoscopy - not due -- Mammogram - due. Patient to call [place] to arrange. She understands that it is her responsibility to arrange this. -- Weight screening: obese: discussed management options, including lifestyle, dietary, and exercise. -- Depression screening negative (PHQ-9) -- Nutrition: normal -- cholesterol screening: per PCP -- osteoporosis screening: not due -- tobacco screening: not using -- alcohol screening: AUDIT questionnaire indicates low-risk usage. -- family history of breast cancer screening: done. not at high risk. -- no evidence of domestic violence or intimate partner violence. -- STD screening: gonorrhea/chlamydia NAAT not collected per patient request. -- pap smear not collected per ASCCP guidelines -- patient received her COVID19 vaccine in March this year.  DIscussed weight loss in detail.   Prentice Docker, MD 10/13/2019 11:45 AM

## 2019-12-14 ENCOUNTER — Other Ambulatory Visit: Payer: Self-pay | Admitting: Obstetrics and Gynecology

## 2019-12-14 DIAGNOSIS — Z1231 Encounter for screening mammogram for malignant neoplasm of breast: Secondary | ICD-10-CM

## 2020-01-01 ENCOUNTER — Other Ambulatory Visit: Payer: Self-pay

## 2020-01-01 ENCOUNTER — Ambulatory Visit
Admission: RE | Admit: 2020-01-01 | Discharge: 2020-01-01 | Disposition: A | Discharge status: Home / Self Care | Payer: BC Managed Care – PPO | Source: Ambulatory Visit | Attending: Obstetrics and Gynecology | Admitting: Obstetrics and Gynecology

## 2020-01-01 DIAGNOSIS — Z1231 Encounter for screening mammogram for malignant neoplasm of breast: Secondary | ICD-10-CM | POA: Insufficient documentation

## 2021-07-31 ENCOUNTER — Other Ambulatory Visit: Payer: Self-pay | Admitting: Obstetrics

## 2021-07-31 ENCOUNTER — Ambulatory Visit (INDEPENDENT_AMBULATORY_CARE_PROVIDER_SITE_OTHER): Payer: BC Managed Care – PPO | Admitting: Obstetrics

## 2021-07-31 ENCOUNTER — Encounter: Payer: Self-pay | Admitting: Obstetrics

## 2021-07-31 VITALS — BP 138/90 | Ht 62.0 in | Wt 271.2 lb

## 2021-07-31 DIAGNOSIS — R21 Rash and other nonspecific skin eruption: Secondary | ICD-10-CM

## 2021-07-31 DIAGNOSIS — Z1231 Encounter for screening mammogram for malignant neoplasm of breast: Secondary | ICD-10-CM

## 2021-07-31 DIAGNOSIS — Z809 Family history of malignant neoplasm, unspecified: Secondary | ICD-10-CM

## 2021-07-31 DIAGNOSIS — N6310 Unspecified lump in the right breast, unspecified quadrant: Secondary | ICD-10-CM

## 2021-07-31 DIAGNOSIS — Z01411 Encounter for gynecological examination (general) (routine) with abnormal findings: Secondary | ICD-10-CM

## 2021-07-31 DIAGNOSIS — Z1211 Encounter for screening for malignant neoplasm of colon: Secondary | ICD-10-CM

## 2021-07-31 DIAGNOSIS — N63 Unspecified lump in unspecified breast: Secondary | ICD-10-CM

## 2021-07-31 DIAGNOSIS — Z6841 Body Mass Index (BMI) 40.0 and over, adult: Secondary | ICD-10-CM

## 2021-07-31 LAB — POCT URINALYSIS DIPSTICK
Bilirubin, UA: NEGATIVE
Blood, UA: NEGATIVE
Glucose, UA: NEGATIVE
Ketones, UA: NEGATIVE
Leukocytes, UA: NEGATIVE
Nitrite, UA: NEGATIVE
Protein, UA: NEGATIVE
Spec Grav, UA: 1.02 (ref 1.010–1.025)
Urobilinogen, UA: 0.2 E.U./dL
pH, UA: 7 (ref 5.0–8.0)

## 2021-07-31 MED ORDER — CLOTRIMAZOLE 1 % EX CREA: 1.0000 "application " | TOPICAL_CREAM | Freq: Two times a day (BID) | CUTANEOUS | 0 refills | Status: DC

## 2021-07-31 NOTE — Progress Notes (Signed)
Chief Complaint  Patient presents with   Annual Exam   Patient Jenny Wade is an 46 y.o. year old G1P1001 Patient's last menstrual period was 07/04/2021. currently condoms for contraception who presents for annual. Patient is exercising regularly. Patient does perform self breast exam. Has had no recent mammo. Does have occasional tenderness near left armpit on left. Patient reports family history of genetic cancer (father with colon) and she has had genetic cancer testing, negative.  Patient takes multivitamin. Patient is content with the birth control method. Patient is sexually active with no problems. Patient reports  vasomotor symptoms with night sweats, or vaginal dryness. Patients pronouns are she her.  Primary care provider: Patient, No Pcp Per (Inactive)  Review of Systems  Constitutional:  Negative for activity change, appetite change, chills, diaphoresis, fatigue, fever and unexpected weight change.  HENT:  Negative for congestion, dental problem, drooling, ear discharge, ear pain, facial swelling, hearing loss, mouth sores, nosebleeds, postnasal drip, rhinorrhea, sinus pressure, sinus pain, sneezing, sore throat, tinnitus, trouble swallowing and voice change.   Eyes:  Negative for photophobia, pain, discharge, redness, itching and visual disturbance.  Respiratory:  Positive for cough. Negative for apnea, choking, chest tightness, shortness of breath, wheezing and stridor.   Cardiovascular:  Negative for chest pain, palpitations and leg swelling.  Gastrointestinal:  Negative for abdominal distention, abdominal pain, anal bleeding, blood in stool, constipation, diarrhea, nausea, rectal pain and vomiting.  Endocrine: Negative for cold intolerance, heat intolerance, polydipsia, polyphagia and polyuria.  Genitourinary:  Negative for decreased urine volume, difficulty urinating, dyspareunia, dysuria, enuresis, flank pain, frequency, genital sores, hematuria, menstrual problem, pelvic  pain, urgency, vaginal bleeding, vaginal discharge and vaginal pain.  Musculoskeletal:  Negative for arthralgias, back pain, gait problem, joint swelling, myalgias, neck pain and neck stiffness.  Skin:  Negative for color change, pallor, rash and wound.  Allergic/Immunologic: Negative for environmental allergies, food allergies and immunocompromised state.  Neurological:  Negative for dizziness, tremors, seizures, syncope, facial asymmetry, speech difficulty, weakness, light-headedness, numbness and headaches.  Hematological:  Negative for adenopathy. Does not bruise/bleed easily.  Psychiatric/Behavioral:  Negative for agitation, behavioral problems, confusion, decreased concentration, dysphoric mood, hallucinations, self-injury, sleep disturbance and suicidal ideas. The patient is not nervous/anxious and is not hyperactive.     Menstrual history: Menarche: 12 Period Cycle (Days): 26 Period Duration (Days): 5-7 Period Pattern: Regular Menstrual Flow: Moderate, Heavy Dysmenorrhea: (!) Moderate Dysmenorrhea Symptoms: Cramping  Past Medical History:  Diagnosis Date   No known health problems    Past Surgical History:  Procedure Laterality Date   NO PAST SURGERIES     Family History  Problem Relation Age of Onset   Skin cancer Father    Colon cancer Father    Lung cancer Paternal Grandmother    Breast cancer Paternal Grandmother 33   Breast cancer Maternal Aunt        mat great aunt   Social History   Socioeconomic History   Marital status: Married    Spouse name: Not on file   Number of children: Not on file   Years of education: Not on file   Highest education level: Not on file  Occupational History   Not on file  Tobacco Use   Smoking status: Never   Smokeless tobacco: Never  Vaping Use   Vaping Use: Never used  Substance and Sexual Activity   Alcohol use: Yes   Drug use: No   Sexual activity: Yes    Birth control/protection: None  Other Topics  Concern   Not on  file  Social History Narrative   Not on file   Social Determinants of Health   Financial Resource Strain: Not on file  Food Insecurity: Not on file  Transportation Needs: Not on file  Physical Activity: Not on file  Stress: Not on file  Social Connections: Not on file  Intimate Partner Violence: Not on file   Patient denies abnormal paps. Patient denies pelvic infections. Patient denies domestic violence or sexual abuse  Health Maintenance  Topic Date Due   HIV Screening  Never done   Hepatitis C Screening  Never done   TETANUS/TDAP  Never done   COVID-19 Vaccine (2 - Moderna series) 06/23/2019   PAP SMEAR-Modifier  04/02/2020   COLONOSCOPY (Pts 45-25yr Insurance coverage will need to be confirmed)  Never done   INFLUENZA VACCINE  09/30/2021   HPV VACCINES  Aged Out    Medicine list and allergies reviewed and updated.     Objective:  BP 138/90   Ht '5\' 2"'  (1.575 m)   Wt 271 lb 3.2 oz (123 kg)   LMP 07/04/2021   BMI 49.60 kg/m     07/31/2021    9:27 AM  Depression screen PHQ 2/9  Decreased Interest 0  Down, Depressed, Hopeless 0  PHQ - 2 Score 0  Altered sleeping 2  Tired, decreased energy 1  Change in appetite 0  Feeling bad or failure about yourself  0  Trouble concentrating 0  Moving slowly or fidgety/restless 0  Suicidal thoughts 0  PHQ-9 Score 3  Difficult doing work/chores Not difficult at all      07/31/2021    9:27 AM  GAD 7 : Generalized Anxiety Score  Nervous, Anxious, on Edge 1  Control/stop worrying 0  Worry too much - different things 0  Trouble relaxing 0  Restless 0  Easily annoyed or irritable 0  Afraid - awful might happen 0  Total GAD 7 Score 1  Anxiety Difficulty Not difficult at all   Physical Exam Vitals and nursing note reviewed. Exam conducted with a chaperone present.  Constitutional:      General: She is not in acute distress.    Appearance: Normal appearance. She is obese. She is not ill-appearing.  HENT:     Head:  Normocephalic and atraumatic.     Mouth/Throat:     Mouth: Mucous membranes are moist.     Pharynx: No oropharyngeal exudate.  Eyes:     Extraocular Movements: Extraocular movements intact.  Neck:     Thyroid: No thyromegaly.  Cardiovascular:     Rate and Rhythm: Normal rate and regular rhythm.     Pulses: Normal pulses.     Heart sounds: Normal heart sounds.  Pulmonary:     Effort: Pulmonary effort is normal. No respiratory distress.     Breath sounds: Normal breath sounds.  Chest:     Chest wall: No mass or tenderness.  Breasts:    Breasts are symmetrical.     Right: Normal. No mass, nipple discharge, skin change or tenderness.     Left: Normal. No mass, nipple discharge, skin change or tenderness.    Abdominal:     General: There is no distension.     Palpations: There is no mass.     Tenderness: There is no abdominal tenderness. There is no rebound.     Hernia: No hernia is present.  Genitourinary:    General: Normal vulva.     Exam position:  Lithotomy position.     Pubic Area: No rash.      Labia:        Right: No rash, tenderness or lesion.        Left: No rash, tenderness or lesion.      Urethra: No urethral lesion.     Vagina: Normal. No foreign body. No vaginal discharge, tenderness or lesions.     Cervix: No discharge, friability, lesion, erythema or cervical bleeding.     Uterus: Normal. Not enlarged, not tender and no uterine prolapse.      Adnexa: Right adnexa normal and left adnexa normal.       Right: No tenderness or fullness.         Left: No tenderness or fullness.      Musculoskeletal:        General: No tenderness. Normal range of motion.     Cervical back: Normal range of motion. No tenderness.     Right lower leg: No edema.     Left lower leg: No edema.  Lymphadenopathy:     Cervical: No cervical adenopathy.     Upper Body:     Right upper body: No axillary adenopathy.     Left upper body: No axillary adenopathy.     Lower Body: No right  inguinal adenopathy. No left inguinal adenopathy.  Skin:    General: Skin is warm and dry.  Neurological:     General: No focal deficit present.     Mental Status: She is alert and oriented to person, place, and time. Mental status is at baseline.     Coordination: Coordination normal.     Gait: Gait normal.     Deep Tendon Reflexes: Reflexes normal.  Psychiatric:        Mood and Affect: Mood normal.        Behavior: Behavior normal.        Thought Content: Thought content normal.        Judgment: Judgment normal.    Assessment/Plan:   Encounter for gynecological examination with abnormal finding - Plan: MM 3D SCREEN BREAST BILATERAL, POCT urinalysis dipstick, Ambulatory referral to Family Practice, CBC with Differential, Comp Met (CMET), TSH Rfx on Abnormal to Free T4, HgB A1c, VITAMIN D 25 Hydroxy (Vit-D Deficiency, Fractures), Lipid panel  Family history of cancer  Body mass index (BMI) 45.0-49.9, adult ([place]) - Plan: Ambulatory referral to Family Practice, CBC with Differential, Comp Met (CMET), TSH Rfx on Abnormal to Free T4, HgB A1c, VITAMIN D 25 Hydroxy (Vit-D Deficiency, Fractures), Lipid panel  Encounter for screening mammogram for malignant neoplasm of breast - Plan: MM 3D SCREEN BREAST BILATERAL  Vulvar rash  Screen for colon cancer - Plan: Ambulatory referral to Gastroenterology  Pap up to date due 2024 Monthly self breast exam. Mammogram ordered Colonoscopy referral done  Daily multivitamin recommended. Contraception: condoms declines other Wellness labs ordered; PCP referral done Exercise discussed with patient.  Lotrimin for vulvar/groin lesion - follow up if no improvement  Return in about 1 year (around 08/01/2022), or if symptoms worsen or fail to improve, for annual/well woman.

## 2021-07-31 NOTE — Addendum Note (Signed)
Addended by: Heidi Dach on: 07/31/2021 01:25 PM   Modules accepted: Orders

## 2021-07-31 NOTE — Patient Instructions (Signed)
Have a great year! Please call with any concerns. Don't forget to wear your seatbelt everyday! If you are not signed up on MyChart, please ask [place] how to sign up for it!   In a world where you can be anything, please be kind.   There is no height or weight on file to calculate BMI.  A Healthy Lifestyle: Care Instructions Your Care Instructions  A healthy lifestyle can help you feel good, stay at a healthy weight, and have plenty of energy for both work and play. A healthy lifestyle is something you can share with your whole family. A healthy lifestyle also can lower your risk for serious health problems, such as high blood pressure, heart disease, and diabetes. You can follow a few steps listed below to improve your health and the health of your family. Follow-up care is a key part of your treatment and safety. Be sure to make and go to all appointments, and call your doctor if you are having problems. It's also a good idea to know your test results and keep a list of the medicines you take. How can you care for yourself at home? Do not eat too much sugar, fat, or fast foods. You can still have dessert and treats now and then. The goal is moderation. Start small to improve your eating habits. Pay attention to portion sizes, drink less juice and soda pop, and eat more fruits and vegetables. Eat a healthy amount of food. A 3-ounce serving of meat, for example, is about the size of a deck of cards. Fill the rest of your plate with vegetables and whole grains. Limit the amount of soda and sports drinks you have every day. Drink more water when you are thirsty. Eat at least 5 servings of fruits and vegetables every day. It may seem like a lot, but it is not hard to reach this goal. A serving or helping is 1 piece of fruit, 1 cup of vegetables, or 2 cups of leafy, raw vegetables. Have an apple or some carrot sticks as an afternoon snack instead of a candy bar. Try to have fruits and/or vegetables at  every meal. Make exercise part of your daily routine. You may want to start with simple activities, such as walking, bicycling, or slow swimming. Try to be active 30 to 60 minutes every day. You do not need to do all 30 to 60 minutes all at once. For example, you can exercise 3 times a day for 10 or 20 minutes. Moderate exercise is safe for most people, but it is always a good idea to talk to your doctor before starting an exercise program. Keep moving. Mow the lawn, work in the garden, or [employer]. Take the stairs instead of the elevator at work. If you smoke, quit. People who smoke have an increased risk for heart attack, stroke, cancer, and other lung illnesses. Quitting is hard, but there are ways to boost your chance of quitting tobacco for good. Use nicotine gum, patches, or lozenges. Ask your doctor about stop-smoking programs and medicines. Keep trying. In addition to reducing your risk of diseases in the future, you will notice some benefits soon after you stop using tobacco. If you have shortness of breath or asthma symptoms, they will likely get better within a few weeks after you quit. Limit how much alcohol you drink. Moderate amounts of alcohol (up to 2 drinks a day for men, 1 drink a day for women) are okay. But drinking  too much can lead to liver problems, high blood pressure, and other health problems. Family health If you have a family, there are many things you can do together to improve your health. Eat meals together as a family as often as possible. Eat healthy foods. This includes fruits, vegetables, lean meats and dairy, and whole grains. Include your family in your fitness plan. Most people think of activities such as jogging or tennis as the way to fitness, but there are many ways you and your family can be more active. Anything that makes you breathe hard and gets your heart pumping is exercise. Here are some tips: Walk to do errands or to take your child to school or  the bus. Go for a family bike ride after dinner instead of watching TV. Care instructions adapted under license by your healthcare professional. This care instruction is for use with your licensed healthcare professional. If you have questions about a medical condition or this instruction, always ask your healthcare professional. [place] any warranty or liability for your use of this information.

## 2021-08-02 ENCOUNTER — Telehealth: Payer: Self-pay

## 2021-08-02 ENCOUNTER — Other Ambulatory Visit: Payer: Self-pay

## 2021-08-02 DIAGNOSIS — Z1211 Encounter for screening for malignant neoplasm of colon: Secondary | ICD-10-CM

## 2021-08-02 MED ORDER — NA SULFATE-K SULFATE-MG SULF 17.5-3.13-1.6 GM/177ML PO SOLN: 1.0000 | Freq: Once | ORAL | 0 refills | Status: AC

## 2021-08-02 NOTE — Progress Notes (Signed)
Gastroenterology Pre-Procedure Review  Request Date: 11/21/2021 Requesting Physician: Dr. Allen Norris  PATIENT REVIEW QUESTIONS: The patient responded to the following health history questions as indicated:    1. Are you having any GI issues? no 2. Do you have a personal history of Polyps? no 3. Do you have a family history of Colon Cancer or Polyps? yes (colon cancer) 4. Diabetes Mellitus? no 5. Joint replacements in the past 12 months?no 6. Major health problems in the past 3 months?no 7. Any artificial heart valves, MVP, or defibrillator?no    MEDICATIONS & ALLERGIES:    Patient reports the following regarding taking any anticoagulation/antiplatelet therapy:   Plavix, Coumadin, Eliquis, Xarelto, Lovenox, Pradaxa, Brilinta, or Effient? no Aspirin? no  Patient confirms/reports the following medications:  Current Outpatient Medications  Medication Sig Dispense Refill   albuterol (VENTOLIN HFA) 108 (90 Base) MCG/ACT inhaler 2 inhaled puffs every 4 hours as needed     cetirizine (ZYRTEC) 10 MG tablet Take by mouth.     clotrimazole (LOTRIMIN) 1 % cream Apply 1 application. topically 2 (two) times daily. 30 g 0   No current facility-administered medications for this visit.    Patient confirms/reports the following allergies:  Allergies  Allergen Reactions   Amoxicillin Hives and Rash    No orders of the defined types were placed in this encounter.   AUTHORIZATION INFORMATION Primary Insurance: 1D#: Group #:  Secondary Insurance: 1D#: Group #:  SCHEDULE INFORMATION: Date: 11/21/2021 Time: Location:[facility]

## 2021-08-05 NOTE — Telephone Encounter (Signed)
errors

## 2021-08-12 ENCOUNTER — Other Ambulatory Visit: Payer: BC Managed Care – PPO

## 2021-08-13 LAB — CBC WITH DIFFERENTIAL/PLATELET
Basophils Absolute: 0 10*3/uL (ref 0.0–0.2)
Basos: 1 %
EOS (ABSOLUTE): 0.6 10*3/uL — ABNORMAL HIGH (ref 0.0–0.4)
Eos: 8 %
Hematocrit: 42.5 % (ref 34.0–46.6)
Hemoglobin: 14.5 g/dL (ref 11.1–15.9)
Immature Grans (Abs): 0 10*3/uL (ref 0.0–0.1)
Immature Granulocytes: 0 %
Lymphocytes Absolute: 2.2 10*3/uL (ref 0.7–3.1)
Lymphs: 29 %
MCH: 30.3 pg (ref 26.6–33.0)
MCHC: 34.1 g/dL (ref 31.5–35.7)
MCV: 89 fL (ref 79–97)
Monocytes Absolute: 0.6 10*3/uL (ref 0.1–0.9)
Monocytes: 9 %
Neutrophils Absolute: 4 10*3/uL (ref 1.4–7.0)
Neutrophils: 53 %
Platelets: 290 10*3/uL (ref 150–450)
RBC: 4.78 x10E6/uL (ref 3.77–5.28)
RDW: 12.3 % (ref 11.7–15.4)
WBC: 7.4 10*3/uL (ref 3.4–10.8)

## 2021-08-13 LAB — COMPREHENSIVE METABOLIC PANEL
ALT: 17 IU/L (ref 0–32)
AST: 16 IU/L (ref 0–40)
Albumin/Globulin Ratio: 1.5 (ref 1.2–2.2)
Albumin: 4.3 g/dL (ref 3.8–4.8)
Alkaline Phosphatase: 94 IU/L (ref 44–121)
BUN/Creatinine Ratio: 21 (ref 9–23)
BUN: 14 mg/dL (ref 6–24)
Bilirubin Total: 0.3 mg/dL (ref 0.0–1.2)
CO2: 22 mmol/L (ref 20–29)
Calcium: 9.8 mg/dL (ref 8.7–10.2)
Chloride: 108 mmol/L — ABNORMAL HIGH (ref 96–106)
Creatinine, Ser: 0.67 mg/dL (ref 0.57–1.00)
Globulin, Total: 2.9 g/dL (ref 1.5–4.5)
Glucose: 95 mg/dL (ref 70–99)
Potassium: 5.2 mmol/L (ref 3.5–5.2)
Sodium: 143 mmol/L (ref 134–144)
Total Protein: 7.2 g/dL (ref 6.0–8.5)
eGFR: 109 mL/min/{1.73_m2} (ref >59)

## 2021-08-13 LAB — HEMOGLOBIN A1C
Est. average glucose Bld gHb Est-mCnc: 111 mg/dL
Hgb A1c MFr Bld: 5.5 % (ref 4.8–5.6)

## 2021-08-13 LAB — VITAMIN D 25 HYDROXY (VIT D DEFICIENCY, FRACTURES): Vit D, 25-Hydroxy: 18.7 ng/mL — ABNORMAL LOW (ref 30.0–100.0)

## 2021-08-13 LAB — LIPID PANEL
Chol/HDL Ratio: 3.3 ratio (ref 0.0–4.4)
Cholesterol, Total: 187 mg/dL (ref 100–199)
HDL: 57 mg/dL (ref >39)
LDL Chol Calc (NIH): 113 mg/dL — ABNORMAL HIGH (ref 0–99)
Triglycerides: 96 mg/dL (ref 0–149)
VLDL Cholesterol Cal: 17 mg/dL (ref 5–40)

## 2021-08-25 ENCOUNTER — Ambulatory Visit
Admission: RE | Admit: 2021-08-25 | Discharge: 2021-08-25 | Disposition: A | Discharge status: Home / Self Care | Payer: BC Managed Care – PPO | Source: Ambulatory Visit | Attending: Obstetrics | Admitting: Obstetrics

## 2021-08-25 DIAGNOSIS — Z1231 Encounter for screening mammogram for malignant neoplasm of breast: Secondary | ICD-10-CM | POA: Insufficient documentation

## 2021-08-25 DIAGNOSIS — N63 Unspecified lump in unspecified breast: Secondary | ICD-10-CM | POA: Diagnosis present

## 2021-10-27 NOTE — Anesthesia Preprocedure Evaluation (Deleted)
Anesthesia Evaluation  Patient identified by MRN, date of birth, ID band Patient awake    Reviewed: Allergy & Precautions, NPO status , Patient's Chart, lab work & pertinent test results  Airway Mallampati: III  TM Distance: >3 FB Neck ROM: full    Dental  (+) Chipped   Pulmonary neg pulmonary ROS,    Pulmonary exam normal        Cardiovascular negative cardio ROS Normal cardiovascular exam     Neuro/Psych negative neurological ROS  negative psych ROS   GI/Hepatic negative GI ROS, Neg liver ROS,   Endo/Other  negative endocrine ROS  Renal/GU negative Renal ROS  negative genitourinary   Musculoskeletal   Abdominal   Peds  Hematology negative hematology ROS (+)   Anesthesia Other Findings      Reproductive/Obstetrics negative OB ROS                             Anesthesia Physical Anesthesia Plan  ASA: 2  Anesthesia Plan: General   Post-op Pain Management: Minimal or no pain anticipated   Induction: Intravenous  PONV Risk Score and Plan: Propofol infusion, TIVA and Treatment may vary due to age or medical condition  Airway Management Planned: Natural Airway  Additional Equipment:   Intra-op Plan:   Post-operative Plan:   Informed Consent:   Plan Discussed with: Anesthesiologist, CRNA and Surgeon  Anesthesia Plan Comments:         Anesthesia Quick Evaluation

## 2021-11-12 ENCOUNTER — Encounter: Payer: Self-pay | Admitting: Gastroenterology

## 2021-11-13 ENCOUNTER — Encounter: Payer: Self-pay | Admitting: Gastroenterology

## 2021-11-20 NOTE — Anesthesia Preprocedure Evaluation (Addendum)
Anesthesia Evaluation  Patient identified by MRN, date of birth, ID band Patient awake    Reviewed: Allergy & Precautions, NPO status , Patient's Chart, lab work & pertinent test results  Airway Mallampati: III  TM Distance: >3 FB Neck ROM: full    Dental no notable dental hx.    Pulmonary shortness of breath, asthma ,    Pulmonary exam normal        Cardiovascular negative cardio ROS Normal cardiovascular exam     Neuro/Psych negative neurological ROS  negative psych ROS   GI/Hepatic negative GI ROS, Neg liver ROS,   Endo/Other  Morbid obesity  Renal/GU negative Renal ROS  negative genitourinary   Musculoskeletal   Abdominal (+) + obese,   Peds  Hematology negative hematology ROS (+)   Anesthesia Other Findings Past Medical History: No date: Asthma No date: Dyspnea No date: No known health problems  Past Surgical History: No date: NO PAST SURGERIES  BMI    Body Mass Index: 49.38 kg/m      Reproductive/Obstetrics negative OB ROS                            Anesthesia Physical Anesthesia Plan  ASA: 3  Anesthesia Plan: General   Post-op Pain Management: Minimal or no pain anticipated   Induction: Intravenous  PONV Risk Score and Plan: Propofol infusion and TIVA  Airway Management Planned: Natural Airway  Additional Equipment:   Intra-op Plan:   Post-operative Plan:   Informed Consent: I have reviewed the patients History and Physical, chart, labs and discussed the procedure including the risks, benefits and alternatives for the proposed anesthesia with the patient or authorized representative who has indicated his/her understanding and acceptance.     Dental Advisory Given  Plan Discussed with: Anesthesiologist, CRNA and Surgeon  Anesthesia Plan Comments:        Anesthesia Quick Evaluation

## 2021-11-21 ENCOUNTER — Ambulatory Visit: Payer: BC Managed Care – PPO | Admitting: Anesthesiology

## 2021-11-21 ENCOUNTER — Other Ambulatory Visit: Payer: Self-pay

## 2021-11-21 ENCOUNTER — Encounter
Admission: RE | Disposition: A | Discharge status: Home / Self Care | Payer: Self-pay | Source: Home / Self Care | Attending: Gastroenterology

## 2021-11-21 ENCOUNTER — Ambulatory Visit
Admission: RE | Admit: 2021-11-21 | Discharge: 2021-11-21 | Disposition: A | Discharge status: Home / Self Care | Payer: BC Managed Care – PPO | Attending: Gastroenterology | Admitting: Gastroenterology

## 2021-11-21 ENCOUNTER — Encounter: Payer: Self-pay | Admitting: Gastroenterology

## 2021-11-21 DIAGNOSIS — Z6841 Body Mass Index (BMI) 40.0 and over, adult: Secondary | ICD-10-CM | POA: Insufficient documentation

## 2021-11-21 DIAGNOSIS — Z1211 Encounter for screening for malignant neoplasm of colon: Secondary | ICD-10-CM | POA: Insufficient documentation

## 2021-11-21 DIAGNOSIS — J45909 Unspecified asthma, uncomplicated: Secondary | ICD-10-CM | POA: Diagnosis not present

## 2021-11-21 DIAGNOSIS — K635 Polyp of colon: Secondary | ICD-10-CM | POA: Diagnosis not present

## 2021-11-21 DIAGNOSIS — D125 Benign neoplasm of sigmoid colon: Secondary | ICD-10-CM | POA: Insufficient documentation

## 2021-11-21 DIAGNOSIS — Z800 Family history of malignant neoplasm of digestive organs: Secondary | ICD-10-CM | POA: Diagnosis not present

## 2021-11-21 HISTORY — PX: COLONOSCOPY WITH PROPOFOL: SHX5780

## 2021-11-21 HISTORY — PX: POLYPECTOMY: SHX5525

## 2021-11-21 HISTORY — DX: Dyspnea, unspecified: R06.00

## 2021-11-21 HISTORY — DX: Unspecified asthma, uncomplicated: J45.909

## 2021-11-21 LAB — POCT PREGNANCY, URINE: Preg Test, Ur: NEGATIVE

## 2021-11-21 SURGERY — COLONOSCOPY WITH PROPOFOL
Anesthesia: Monitor Anesthesia Care | Site: Rectum

## 2021-11-21 MED ORDER — PROPOFOL 10 MG/ML IV BOLUS
INTRAVENOUS | Status: DC | PRN
  Administered 2021-11-21 (×3): 50 mg via INTRAVENOUS
  Administered 2021-11-21: 100 mg via INTRAVENOUS

## 2021-11-21 MED ORDER — STERILE WATER FOR IRRIGATION IR SOLN
Status: DC | PRN
  Administered 2021-11-21: 150 mL

## 2021-11-21 MED ORDER — LACTATED RINGERS IV SOLN: INTRAVENOUS | Status: DC

## 2021-11-21 MED ORDER — SODIUM CHLORIDE 0.9 % IV SOLN: INTRAVENOUS | Status: DC

## 2021-11-21 SURGICAL SUPPLY — 21 items

## 2021-11-21 NOTE — Anesthesia Postprocedure Evaluation (Signed)
Anesthesia Post Note  Patient: Jenny Wade  Procedure(s) Performed: COLONOSCOPY WITH PROPOFOL (Rectum) POLYPECTOMY (Rectum)     Patient location during evaluation: PACU Anesthesia Type: General Level of consciousness: awake and alert Pain management: pain level controlled Vital Signs Assessment: post-procedure vital signs reviewed and stable Respiratory status: spontaneous breathing, nonlabored ventilation and respiratory function stable Cardiovascular status: blood pressure returned to baseline and stable Postop Assessment: no apparent nausea or vomiting Anesthetic complications: no   No notable events documented.  [place] Ouch

## 2021-11-21 NOTE — Op Note (Signed)
[facility] Patient Name: Jenny Wade Procedure Date: 11/21/2021 9:40 AM MRN: [id number] Account #: [id number] Date of Birth: Oct 01, 1975 Admit Type: Outpatient Age: 46 Room: [facility] OR ROOM 01 Gender: Female Note Status: Finalized Instrument Name: [id number] Procedure:             Colonoscopy Indications:           Screening for colorectal malignant neoplasm Providers:             Lucilla Lame MD, MD Medicines:             Propofol per Anesthesia Complications:         No immediate complications. Procedure:             Pre-Anesthesia Assessment:                        - Prior to the procedure, a History and Physical was                         performed, and patient medications and allergies were                         reviewed. The patient's tolerance of previous                         anesthesia was also reviewed. The risks and benefits                         of the procedure and the sedation options and risks                         were discussed with the patient. All questions were                         answered, and informed consent was obtained. Prior                         Anticoagulants: The patient has taken no previous                         anticoagulant or antiplatelet agents. ASA Grade                         Assessment: II - A patient with mild systemic disease.                         After reviewing the risks and benefits, the patient                         was deemed in satisfactory condition to undergo the                         procedure.                        After obtaining informed consent, the colonoscope was                         passed under direct vision. Throughout the procedure,  the patient's blood pressure, pulse, and oxygen                         saturations were monitored continuously. The was                         introduced through the anus and advanced to the the                          cecum, identified by appendiceal orifice and ileocecal                         valve. The colonoscopy was performed without                         difficulty. The patient tolerated the procedure well.                         The quality of the bowel preparation was excellent. Findings:      The perianal and digital rectal examinations were normal.      A 4 mm polyp was found in the sigmoid colon. The polyp was sessile. The       polyp was removed with a cold snare. Resection and retrieval were       complete. Impression:            - One 4 mm polyp in the sigmoid colon, removed with a                         cold snare. Resected and retrieved. Recommendation:        - Discharge patient to home.                        - Resume previous diet.                        - Continue present medications.                        - Await pathology results.                        - If the pathology report reveals adenomatous tissue,                         then repeat the colonoscopy for surveillance in 7                         years otherwise 10 years. Procedure Code(s):     --- Professional ---                        [phone], Colonoscopy, flexible; with removal of                         tumor(s), polyp(s), or other lesion(s) by snare                         technique Diagnosis Code(s):     --- Professional ---  Z12.11, Encounter for screening for malignant neoplasm                         of colon                        K63.5, Polyp of colon CPT copyright 2019 American Medical Association. All rights reserved. The codes documented in this report are preliminary and upon coder review may  be revised to meet current compliance requirements. Lucilla Lame MD, MD 11/21/2021 10:07:57 AM This report has been signed electronically. Number of Addenda: 0 Note Initiated On: 11/21/2021 9:40 AM Scope Withdrawal Time: 0 hours 7 minutes 54 seconds  Total Procedure Duration: 0  hours 11 minutes 9 seconds  Estimated Blood Loss:  Estimated blood loss: none.      [facility]

## 2021-11-21 NOTE — H&P (Signed)
   Lucilla Lame, MD [address] [place], [place] [address] Phone: [phone] Fax : [phone]  Primary Care Physician:  Patient, No Pcp Per Primary Gastroenterologist:  Dr. Allen Norris  Pre-Procedure History & Physical: HPI:  Jenny Wade is a 46 y.o. female is here for a screening colonoscopy.   Past Medical History:  Diagnosis Date   Asthma    Dyspnea    No known health problems     Past Surgical History:  Procedure Laterality Date   NO PAST SURGERIES      Prior to Admission medications   Medication Sig Start Date End Date Taking? Authorizing Provider  albuterol (VENTOLIN HFA) 108 (90 Base) MCG/ACT inhaler 2 inhaled puffs every 4 hours as needed 08/27/08  Yes [provider]  cetirizine (ZYRTEC) 10 MG tablet Take by mouth.   Yes [provider]  clotrimazole (LOTRIMIN) 1 % cream Apply 1 application. topically 2 (two) times daily. 07/31/21  Yes April Manson, MD  Norethindrone-Ethinyl Estradiol-Fe Biphas (LO LOESTRIN FE) 1 MG-10 MCG / 10 MCG tablet Take 1 tablet by mouth daily. 04/02/17 02/10/19  Will Bonnet, MD    Allergies as of 08/02/2021 - Review Complete 08/02/2021  Allergen Reaction Noted   Amoxicillin Hives and Rash 05/09/2015    Family History  Problem Relation Age of Onset   Skin cancer Father    Colon cancer Father    Lung cancer Paternal Grandmother    Breast cancer Paternal Grandmother 75   Breast cancer Maternal Aunt        mat great aunt    Social History   Socioeconomic History   Marital status: Married    Spouse name: Not on file   Number of children: Not on file   Years of education: Not on file   Highest education level: Not on file  Occupational History   Not on file  Tobacco Use   Smoking status: Never   Smokeless tobacco: Never  Vaping Use   Vaping Use: Never used  Substance and Sexual Activity   Alcohol use: Yes   Drug use: No   Sexual activity: Yes    Birth control/protection: None  Other  Topics Concern   Not on file  Social History Narrative   Not on file   Social Determinants of Health   Financial Resource Strain: Not on file  Food Insecurity: Not on file  Transportation Needs: Not on file  Physical Activity: Not on file  Stress: Not on file  Social Connections: Not on file  Intimate Partner Violence: Not on file    Review of Systems: See HPI, otherwise negative ROS  Physical Exam: BP (!) 148/81   Pulse 93   Temp (!) 97.2 F (36.2 C) (Temporal)   Resp 15   Ht '5\' 2"'$  (1.575 m)   Wt 122 kg   SpO2 96%   BMI 49.20 kg/m  General:   Alert,  pleasant and cooperative in NAD Head:  Normocephalic and atraumatic. Neck:  Supple; no masses or thyromegaly. Lungs:  Clear throughout to auscultation.    Heart:  Regular rate and rhythm. Abdomen:  Soft, nontender and nondistended. Normal bowel sounds, without guarding, and without rebound.   Neurologic:  Alert and  oriented x4;  grossly normal neurologically.  Impression/Plan: Jenny Wade is now here to undergo a screening colonoscopy.  Risks, benefits, and alternatives regarding colonoscopy have been reviewed with the patient.  Questions have been answered.  All parties agreeable.

## 2021-11-21 NOTE — Transfer of Care (Signed)
Immediate Anesthesia Transfer of Care Note  Patient: Jenny Wade  Procedure(s) Performed: COLONOSCOPY WITH PROPOFOL (Rectum) POLYPECTOMY (Rectum)  Patient Location: PACU  Anesthesia Type: MAC  Level of Consciousness: awake, alert  and patient cooperative  Airway and Oxygen Therapy: Patient Spontanous Breathing and Patient connected to supplemental oxygen  Post-op Assessment: Post-op Vital signs reviewed, Patient's Cardiovascular Status Stable, Respiratory Function Stable, Patent Airway and No signs of Nausea or vomiting  Post-op Vital Signs: Reviewed and stable  Complications: No notable events documented.

## 2021-11-24 LAB — SURGICAL PATHOLOGY

## 2021-11-27 ENCOUNTER — Encounter: Payer: Self-pay | Admitting: Gastroenterology

## 2022-02-01 ENCOUNTER — Encounter: Payer: Self-pay | Admitting: Emergency Medicine

## 2022-02-01 ENCOUNTER — Ambulatory Visit
Admission: EM | Admit: 2022-02-01 | Discharge: 2022-02-01 | Disposition: A | Discharge status: Home / Self Care | Payer: BC Managed Care – PPO | Attending: Physician Assistant | Admitting: Physician Assistant

## 2022-02-01 DIAGNOSIS — J4521 Mild intermittent asthma with (acute) exacerbation: Secondary | ICD-10-CM

## 2022-02-01 MED ORDER — IPRATROPIUM-ALBUTEROL 0.5-2.5 (3) MG/3ML IN SOLN
3.0000 mL | Freq: Once | RESPIRATORY_TRACT | Status: AC
  Administered 2022-02-01: 3 mL via RESPIRATORY_TRACT

## 2022-02-01 MED ORDER — PREDNISONE 10 MG (21) PO TBPK: ORAL_TABLET | ORAL | 0 refills | Status: DC

## 2022-02-01 MED ORDER — BENZONATATE 100 MG PO CAPS: 100.0000 mg | ORAL_CAPSULE | Freq: Three times a day (TID) | ORAL | 0 refills | Status: DC

## 2022-02-01 NOTE — ED Triage Notes (Addendum)
Patient states that she was diagnosed with COVID 2 weeks ago. Patient c/o cough and chest congestion and wheezing that started 2 days ago.  Patient reports history of asthma.  Patient has used her albuterol inhaler twice today.  Patient denies fevers.

## 2022-02-01 NOTE — Discharge Instructions (Signed)
I suspect that you have a virus that is flared your asthma.  Continue albuterol inhaler every 4-6 hours as needed.  Start prednisone taper.  Do not take NSAIDs with this medication including aspirin, ibuprofen/Advil, naproxen/Aleve.  Make sure you rest and drink plenty of fluid.  You can use over-the-counter medications including Mucinex, Flonase, Tylenol for symptom relief.  If your symptoms are not improving or if anything worsens and you develop high fever not respond to medication, worsening cough, chest pain, shortness of breath not responding to albuterol you should be seen immediately.

## 2022-02-01 NOTE — ED Provider Notes (Signed)
[facility]    CSN: [id number] Arrival date & time: 02/01/22  1345      History   Chief Complaint Chief Complaint  Patient presents with   Cough    Wheezing - Entered by patient    HPI Jenny Wade is a 46 y.o. female.   Patient presents today with a 2 to 3-day history of cough and wheezing.  She was diagnosed with COVID approximately 6 weeks ago but had complete resolution of symptoms until recurrence a few days ago.  She has been using her albuterol inhaler as she has a history of asthma which is provided only temporary relief of symptoms.  She is not currently on a maintenance medication for asthma as she underwent allergy injections and did not require this any further.  Denies any recent antibiotics or steroids.  She has been using Mucinex and the inhaler without improvement.  Denies any known sick contacts.  She had 2 COVID vaccines prior to COVID infection.    Past Medical History:  Diagnosis Date   Asthma    Dyspnea    No known health problems     Patient Active Problem List   Diagnosis Date Noted   Colon cancer screening    Polyp of sigmoid colon     Past Surgical History:  Procedure Laterality Date   COLONOSCOPY WITH PROPOFOL N/A 11/21/2021   Procedure: COLONOSCOPY WITH PROPOFOL;  Surgeon: Lucilla Lame, MD;  Location: [facility];  Service: Endoscopy;  Laterality: N/A;   NO PAST SURGERIES     POLYPECTOMY  11/21/2021   Procedure: POLYPECTOMY;  Surgeon: Lucilla Lame, MD;  Location: [facility];  Service: Endoscopy;;    OB History     Gravida  1   Para  1   Term  1   Preterm      AB      Living  1      SAB      IAB      Ectopic      Multiple      Live Births  1            Home Medications    Prior to Admission medications   Medication Sig Start Date End Date Taking? Authorizing Provider  albuterol (VENTOLIN HFA) 108 (90 Base) MCG/ACT inhaler 2 inhaled puffs every 4 hours as needed 08/27/08  Yes  [provider]  benzonatate (TESSALON) 100 MG capsule Take 1 capsule (100 mg total) by mouth every 8 (eight) hours. 02/01/22  Yes Priscila Bean K, PA-C  predniSONE (STERAPRED UNI-PAK 21 TAB) 10 MG (21) TBPK tablet As directed 02/01/22  Yes Levita Monical K, PA-C  cetirizine (ZYRTEC) 10 MG tablet Take by mouth.    [provider]  clotrimazole (LOTRIMIN) 1 % cream Apply 1 application. topically 2 (two) times daily. 07/31/21   April Manson, MD  Norethindrone-Ethinyl Estradiol-Fe Biphas (LO LOESTRIN FE) 1 MG-10 MCG / 10 MCG tablet Take 1 tablet by mouth daily. 04/02/17 02/10/19  Will Bonnet, MD    Family History Family History  Problem Relation Age of Onset   Skin cancer Father    Colon cancer Father    Lung cancer Paternal Grandmother    Breast cancer Paternal Grandmother 20   Breast cancer Maternal Aunt        mat great aunt    Social History Social History   Tobacco Use   Smoking status: Never   Smokeless tobacco: Never  Vaping Use  Vaping Use: Never used  Substance Use Topics   Alcohol use: Yes   Drug use: No     Allergies   Amoxicillin   Review of Systems Review of Systems  Constitutional:  Positive for activity change. Negative for appetite change, fatigue and fever.  HENT:  Negative for congestion, sinus pressure, sneezing and sore throat.   Respiratory:  Positive for cough, chest tightness and wheezing. Negative for shortness of breath.   Cardiovascular:  Negative for chest pain.  Gastrointestinal:  Negative for abdominal pain, diarrhea, nausea and vomiting.  Neurological:  Negative for dizziness, light-headedness and headaches.     Physical Exam Triage Vital Signs ED Triage Vitals  Enc Vitals Group     BP 02/01/22 1524 (!) 132/92     Pulse Rate 02/01/22 1524 85     Resp 02/01/22 1524 15     Temp 02/01/22 1524 98.2 F (36.8 C)     Temp Source 02/01/22 1524 Oral     SpO2 02/01/22 1524 100 %     Weight 02/01/22 1523 270 lb (122.5 kg)      Height 02/01/22 1523 '5\' 2"'$  (1.575 m)     Head Circumference --      Peak Flow --      Pain Score 02/01/22 1522 6     Pain Loc --      Pain Edu? --      Excl. in [place]? --    No data found.  Updated Vital Signs BP (!) 132/92 (BP Location: Right Arm)   Pulse 85   Temp 98.2 F (36.8 C) (Oral)   Resp 15   Ht '5\' 2"'$  (1.575 m)   Wt 270 lb (122.5 kg)   LMP 01/05/2022 (Approximate)   SpO2 100%   BMI 49.38 kg/m   Visual Acuity Right Eye Distance:   Left Eye Distance:   Bilateral Distance:    Right Eye Near:   Left Eye Near:    Bilateral Near:     Physical Exam Vitals reviewed.  Constitutional:      General: She is awake. She is not in acute distress.    Appearance: Normal appearance. She is well-developed. She is not ill-appearing.     Comments: Very pleasant female appears stated age in no acute distress sitting comfortably in exam room  HENT:     Head: Normocephalic and atraumatic.     Right Ear: Tympanic membrane, ear canal and external ear normal. Tympanic membrane is not erythematous or bulging.     Left Ear: Tympanic membrane, ear canal and external ear normal. Tympanic membrane is not erythematous or bulging.     Nose:     Right Sinus: No maxillary sinus tenderness or frontal sinus tenderness.     Left Sinus: No maxillary sinus tenderness or frontal sinus tenderness.     Mouth/Throat:     Pharynx: Uvula midline. No oropharyngeal exudate or posterior oropharyngeal erythema.  Cardiovascular:     Rate and Rhythm: Normal rate and regular rhythm.     Heart sounds: Normal heart sounds, S1 normal and S2 normal. No murmur heard. Pulmonary:     Effort: Pulmonary effort is normal.     Breath sounds: Wheezing present. No rhonchi or rales.     Comments: Scattered wheezing Psychiatric:        Behavior: Behavior is cooperative.      UC Treatments / Results  Labs (all labs ordered are listed, but only abnormal results are displayed) Labs Reviewed - No  data to  display  EKG   Radiology No results found.  Procedures Procedures (including critical care time)  Medications Ordered in UC Medications  ipratropium-albuterol (DUONEB) 0.5-2.5 (3) MG/3ML nebulizer solution 3 mL (3 mLs Nebulization Given 02/01/22 1556)    Initial Impression / Assessment and Plan / UC Course  I have reviewed the triage vital signs and the nursing notes.  Pertinent labs & imaging results that were available during my care of the patient were reviewed by me and considered in my medical decision making (see chart for details).     Patient is well-appearing, afebrile, nontoxic, nontachycardic.  No indication for COVID testing given she recently recovered from [place] within the past few weeks.  Flu testing was deferred as she has no fever or significant flu symptoms.  Suspect asthma exacerbation as etiology of symptoms.  She was given a DuoNeb in clinic which provided relief of symptoms.  Will start prednisone taper.  She was instructed not to take NSAIDs with this medication.  Reports she has enough of her albuterol inhaler was encouraged to use this every 4-6 hours as needed for shortness of breath and coughing fits.  Can continue over-the-counter medication including Mucinex, Tylenol, Flonase.  She is to rest and drink plenty of fluid.  Discussed that if she has any worsening or changing symptoms including high fever not respond to antipyretics, worsening cough, chest pain, shortness of breath despite albuterol she needs to be seen immediately.  Strict return precautions given.  Work excuse note provided.  Final Clinical Impressions(s) / UC Diagnoses   Final diagnoses:  Mild intermittent asthma with exacerbation     Discharge Instructions      I suspect that you have a virus that is flared your asthma.  Continue albuterol inhaler every 4-6 hours as needed.  Start prednisone taper.  Do not take NSAIDs with this medication including aspirin, ibuprofen/Advil,  naproxen/Aleve.  Make sure you rest and drink plenty of fluid.  You can use over-the-counter medications including Mucinex, Flonase, Tylenol for symptom relief.  If your symptoms are not improving or if anything worsens and you develop high fever not respond to medication, worsening cough, chest pain, shortness of breath not responding to albuterol you should be seen immediately.     ED Prescriptions     Medication Sig Dispense Auth. Provider   predniSONE (STERAPRED UNI-PAK 21 TAB) 10 MG (21) TBPK tablet As directed 21 tablet Larna Capelle K, PA-C   benzonatate (TESSALON) 100 MG capsule Take 1 capsule (100 mg total) by mouth every 8 (eight) hours. 21 capsule Dhanush Jokerst K, PA-C      PDMP not reviewed this encounter.   Terrilee Croak, PA-C 02/01/22 1629

## 2022-02-02 ENCOUNTER — Ambulatory Visit: Payer: BC Managed Care – PPO

## 2022-02-05 ENCOUNTER — Ambulatory Visit (INDEPENDENT_AMBULATORY_CARE_PROVIDER_SITE_OTHER): Payer: BC Managed Care – PPO

## 2022-02-05 ENCOUNTER — Ambulatory Visit
Admission: EM | Admit: 2022-02-05 | Discharge: 2022-02-05 | Disposition: A | Discharge status: Home / Self Care | Payer: BC Managed Care – PPO | Attending: Internal Medicine | Admitting: Internal Medicine

## 2022-02-05 DIAGNOSIS — J45901 Unspecified asthma with (acute) exacerbation: Secondary | ICD-10-CM

## 2022-02-05 DIAGNOSIS — R059 Cough, unspecified: Secondary | ICD-10-CM | POA: Diagnosis not present

## 2022-02-05 DIAGNOSIS — R051 Acute cough: Secondary | ICD-10-CM

## 2022-02-05 DIAGNOSIS — R062 Wheezing: Secondary | ICD-10-CM

## 2022-02-05 MED ORDER — HYDROCOD POLI-CHLORPHE POLI ER 10-8 MG/5ML PO SUER: 5.0000 mL | Freq: Two times a day (BID) | ORAL | 0 refills | Status: DC | PRN

## 2022-02-05 MED ORDER — PREDNISONE 10 MG PO TABS: ORAL_TABLET | ORAL | 0 refills | Status: DC

## 2022-02-05 NOTE — ED Triage Notes (Signed)
Patient presents to UC for dry cough and asthma flare-up x 1 week.. States she was seen on Sunday and given prednisone, tested positive for covid 3 weeks ago. Hx of asthma. Treating asthma flare up with Tessalon pearls, inhaler, and cough syrup with some relief.   Denies fever, chest pain, or SOB.

## 2022-02-05 NOTE — Discharge Instructions (Signed)
Take 2 of the Tessalon perless at a time and see if this helps the cough, if not you may use the Tussionex. I will have you taper the prednisone at a slower pace, so stay on 30 mg for 4 days, then taper down by 10 mg  every 4 days til gone.  Call you primary care doctor or pulmonologist for follow up

## 2022-02-05 NOTE — ED Provider Notes (Signed)
[facility]    CSN: [id number] Arrival date & time: 02/05/22  1640      History   Chief Complaint Chief Complaint  Patient presents with   Cough   Asthma    HPI Selby Slovacek is a 46 y.o. female presents with unimproved wheezing and cough from her asthma since here 4 days ago and has been using her Albuterol inhaler and tapering down on the prednisone as instructed. She is down to 30 mg dose today. She is supposed to go down to 20 mg tomorrow. She denies fever, SOB or chest pain. She was completely over her cough when she had covid 6 weeks ago, and develop these new symptoms.     Past Medical History:  Diagnosis Date   Asthma    Dyspnea    No known health problems     Patient Active Problem List   Diagnosis Date Noted   Colon cancer screening    Polyp of sigmoid colon     Past Surgical History:  Procedure Laterality Date   COLONOSCOPY WITH PROPOFOL N/A 11/21/2021   Procedure: COLONOSCOPY WITH PROPOFOL;  Surgeon: Lucilla Lame, MD;  Location: [facility];  Service: Endoscopy;  Laterality: N/A;   NO PAST SURGERIES     POLYPECTOMY  11/21/2021   Procedure: POLYPECTOMY;  Surgeon: Lucilla Lame, MD;  Location: [facility];  Service: Endoscopy;;    OB History     Gravida  1   Para  1   Term  1   Preterm      AB      Living  1      SAB      IAB      Ectopic      Multiple      Live Births  1            Home Medications    Prior to Admission medications   Medication Sig Start Date End Date Taking? Authorizing Provider  chlorpheniramine-HYDROcodone (TUSSIONEX) 10-8 MG/5ML Take 5 mLs by mouth every 12 (twelve) hours as needed for cough. 02/05/22  Yes Rodriguez-Southworth, Sunday Spillers, PA-C  predniSONE (DELTASONE) 10 MG tablet Taper down every 4 days starting at 30 mg til gone 02/05/22  Yes Rodriguez-Southworth, Sunday Spillers, PA-C  albuterol (VENTOLIN HFA) 108 (90 Base) MCG/ACT inhaler 2 inhaled puffs every 4 hours as needed 08/27/08    [provider]  benzonatate (TESSALON) 100 MG capsule Take 1 capsule (100 mg total) by mouth every 8 (eight) hours. 02/01/22   Raspet, Derry Skill, PA-C  cetirizine (ZYRTEC) 10 MG tablet Take by mouth.    [provider]  clotrimazole (LOTRIMIN) 1 % cream Apply 1 application. topically 2 (two) times daily. 07/31/21   April Manson, MD  predniSONE (STERAPRED UNI-PAK 21 TAB) 10 MG (21) TBPK tablet As directed 02/01/22   Raspet, Erin K, PA-C  Norethindrone-Ethinyl Estradiol-Fe Biphas (LO LOESTRIN FE) 1 MG-10 MCG / 10 MCG tablet Take 1 tablet by mouth daily. 04/02/17 02/10/19  Will Bonnet, MD    Family History Family History  Problem Relation Age of Onset   Skin cancer Father    Colon cancer Father    Lung cancer Paternal Grandmother    Breast cancer Paternal Grandmother 64   Breast cancer Maternal Aunt        mat great aunt    Social History Social History   Tobacco Use   Smoking status: Never   Smokeless tobacco: Never  Vaping Use  Vaping Use: Never used  Substance Use Topics   Alcohol use: Yes   Drug use: No     Allergies   Amoxicillin   Review of Systems Review of Systems  Constitutional:  Negative for fever.  HENT:  Negative for congestion and rhinorrhea.   Respiratory:  Positive for cough and wheezing. Negative for chest tightness and shortness of breath.   Cardiovascular:  Negative for chest pain.     Physical Exam Triage Vital Signs ED Triage Vitals  Enc Vitals Group     BP 02/05/22 1850 (!) 148/75     Pulse Rate 02/05/22 1850 79     Resp 02/05/22 1850 16     Temp 02/05/22 1850 98.5 F (36.9 C)     Temp Source 02/05/22 1850 Oral     SpO2 02/05/22 1850 98 %     Weight --      Height --      Head Circumference --      Peak Flow --      Pain Score 02/05/22 1849 0     Pain Loc --      Pain Edu? --      Excl. in [place]? --    No data found.  Updated Vital Signs BP (!) 148/75 (BP Location: Left Arm)   Pulse 79   Temp 98.5 F (36.9  C) (Oral)   Resp 16   LMP 01/05/2022 (Approximate)   SpO2 98%   Visual Acuity Right Eye Distance:   Left Eye Distance:   Bilateral Distance:    Right Eye Near:   Left Eye Near:    Bilateral Near:      Physical Exam Constitutional:      General: He is not in acute distress.    Appearance: He is not toxic-appearing.  HENT:     Head: Normocephalic.     Right Ear: Tympanic membrane, ear canal and external ear normal.     Left Ear: Ear canal and external ear normal.     Nose: Nose normal.     Mouth/Throat:     Mouth: Mucous membranes are moist.     Pharynx: Oropharynx is clear.  Eyes:     General: No scleral icterus.    Conjunctiva/sclera: Conjunctivae normal.  Cardiovascular:     Rate and Rhythm: Normal rate and regular rhythm.     Heart sounds: No murmur heard.   Pulmonary:     Effort: Pulmonary effort is normal. No respiratory distress.     Breath sounds: Wheezing present with expiration.     Musculoskeletal:        General: Normal range of motion.     Cervical back: Neck supple.  Lymphadenopathy:     Cervical: No cervical adenopathy.  Skin:    General: Skin is warm and dry.     Findings: No rash.  Neurological:     Mental Status: He is alert and oriented to person, place, and time.     Gait: Gait normal.  Psychiatric:        Mood and Affect: Mood normal.        Behavior: Behavior normal.        Thought Content: Thought content normal.        Judgment: Judgment normal.    UC Treatments / Results  Labs (all labs ordered are listed, but only abnormal results are displayed) Labs Reviewed - No data to display  EKG   Radiology DG Chest 2 View  Result Date:  02/05/2022 CLINICAL DATA:  Persistent cough and wheezing EXAM: CHEST - 2 VIEW COMPARISON:  None Available. FINDINGS: The heart size and mediastinal contours are within normal limits. Both lungs are clear. The visualized skeletal structures are unremarkable. IMPRESSION: No active cardiopulmonary disease.  Electronically Signed   By: Donavan Foil M.D.   On: 02/05/2022 19:50    Procedures Procedures (including critical care time)  Medications Ordered in UC Medications - No data to display  Initial Impression / Assessment and Plan / UC Course  I have reviewed the triage vital signs and the nursing notes.  Pertinent imaging results that were available during my care of the patient were reviewed by me and considered in my medical decision making (see chart for details).  Asthma exacerbation, not improving   I will have her double up the Tessalon Perless to 200 mg tid, and I will have her taper down the prednisone slower. See instructions. I sent more Prednisone 10 mg.   If increasing the Tessalon does not help with the cough, I gave her rx for Tussionex as noted.    Final Clinical Impressions(s) / UC Diagnoses   Final diagnoses:  Acute cough  Exacerbation of asthma, unspecified asthma severity, unspecified whether persistent     Discharge Instructions      Take 2 of the Tessalon perless at a time and see if this helps the cough, if not you may use the Tussionex. I will have you taper the prednisone at a slower pace, so stay on 30 mg for 4 days, then taper down by 10 mg  every 4 days til gone.  Call you primary care doctor or pulmonologist for follow up     ED Prescriptions     Medication Sig Dispense Auth. Provider   predniSONE (DELTASONE) 10 MG tablet Taper down every 4 days starting at 30 mg til gone 40 tablet Rodriguez-Southworth, Sunday Spillers, PA-C   chlorpheniramine-HYDROcodone (TUSSIONEX) 10-8 MG/5ML Take 5 mLs by mouth every 12 (twelve) hours as needed for cough. 115 mL Rodriguez-Southworth, Sunday Spillers, PA-C      I have reviewed the PDMP during this encounter.   Shelby Mattocks, Hershal Coria 02/05/22 2121

## 2022-04-27 ENCOUNTER — Ambulatory Visit
Admission: RE | Admit: 2022-04-27 | Discharge: 2022-04-27 | Disposition: A | Discharge status: Home / Self Care | Payer: BC Managed Care – PPO | Source: Ambulatory Visit | Attending: Emergency Medicine | Admitting: Emergency Medicine

## 2022-04-27 VITALS — BP 134/79 | HR 87 | Temp 98.2°F | Resp 16 | Ht 62.0 in | Wt 270.1 lb

## 2022-04-27 DIAGNOSIS — H9202 Otalgia, left ear: Secondary | ICD-10-CM

## 2022-04-27 MED ORDER — CEFDINIR 300 MG PO CAPS: 300.0000 mg | ORAL_CAPSULE | Freq: Two times a day (BID) | ORAL | 0 refills | Status: AC

## 2022-04-27 NOTE — ED Provider Notes (Signed)
[facility]    CSN: [id number] Arrival date & time: 04/27/22  1642      History   Chief Complaint Chief Complaint  Patient presents with   Ear Fullness    left    HPI Jenny Wade is a 47 y.o. female.   Patient presents for evaluation of left-sided ear fullness and pain present for 10 days.  [facility], treated for ear infection and prescribed otic eardrops, completed course as directed but endorses no improvement in symptoms.  Additionally has attempted use of antihistamine and pseudoephedrine which provided no relief.  Denies drainage, pruritus, fevers, congestion.    Past Medical History:  Diagnosis Date   Asthma    Dyspnea    No known health problems     Patient Active Problem List   Diagnosis Date Noted   Colon cancer screening    Polyp of sigmoid colon     Past Surgical History:  Procedure Laterality Date   COLONOSCOPY WITH PROPOFOL N/A 11/21/2021   Procedure: COLONOSCOPY WITH PROPOFOL;  Surgeon: Lucilla Lame, MD;  Location: [place];  Service: Endoscopy;  Laterality: N/A;   NO PAST SURGERIES     POLYPECTOMY  11/21/2021   Procedure: POLYPECTOMY;  Surgeon: Lucilla Lame, MD;  Location: [place];  Service: Endoscopy;;    OB History     Gravida  1   Para  1   Term  1   Preterm      AB      Living  1      SAB      IAB      Ectopic      Multiple      Live Births  1            Home Medications    Prior to Admission medications   Medication Sig Start Date End Date Taking? Authorizing Provider  albuterol (VENTOLIN HFA) 108 (90 Base) MCG/ACT inhaler 2 inhaled puffs every 4 hours as needed 08/27/08   [provider]  benzonatate (TESSALON) 100 MG capsule Take 1 capsule (100 mg total) by mouth every 8 (eight) hours. 02/01/22   Raspet, Derry Skill, PA-C  cetirizine (ZYRTEC) 10 MG tablet Take by mouth.    [provider]  chlorpheniramine-HYDROcodone (TUSSIONEX) 10-8  MG/5ML Take 5 mLs by mouth every 12 (twelve) hours as needed for cough. 02/05/22   Rodriguez-Southworth, Sunday Spillers, PA-C  clotrimazole (LOTRIMIN) 1 % cream Apply 1 application. topically 2 (two) times daily. 07/31/21   April Manson, MD  predniSONE (DELTASONE) 10 MG tablet Taper down every 4 days starting at 30 mg til gone 02/05/22   Rodriguez-Southworth, Sunday Spillers, PA-C  predniSONE (STERAPRED UNI-PAK 21 TAB) 10 MG (21) TBPK tablet As directed 02/01/22   Raspet, Erin K, PA-C  Norethindrone-Ethinyl Estradiol-Fe Biphas (LO LOESTRIN FE) 1 MG-10 MCG / 10 MCG tablet Take 1 tablet by mouth daily. 04/02/17 02/10/19  Will Bonnet, MD    Family History Family History  Problem Relation Age of Onset   Skin cancer Father    Colon cancer Father    Lung cancer Paternal Grandmother    Breast cancer Paternal Grandmother 45   Breast cancer Maternal Aunt        mat great aunt    Social History Social History   Tobacco Use   Smoking status: Never   Smokeless tobacco: Never  Vaping Use   Vaping Use: Never used  Substance Use Topics   Alcohol  use: Yes   Drug use: No     Allergies   Amoxicillin   Review of Systems Review of Systems Defer to HPI    Physical Exam Triage Vital Signs ED Triage Vitals  Enc Vitals Group     BP 04/27/22 1711 134/79     Pulse Rate 04/27/22 1711 87     Resp 04/27/22 1711 16     Temp 04/27/22 1711 98.2 F (36.8 C)     Temp Source 04/27/22 1711 Oral     SpO2 04/27/22 1711 98 %     Weight 04/27/22 1709 270 lb 1 oz (122.5 kg)     Height 04/27/22 1709 '5\' 2"'$  (1.575 m)     Head Circumference --      Peak Flow --      Pain Score 04/27/22 1708 3     Pain Loc --      Pain Edu? --      Excl. in [place]? --    No data found.  Updated Vital Signs BP 134/79 (BP Location: Left Arm)   Pulse 87   Temp 98.2 F (36.8 C) (Oral)   Resp 16   Ht '5\' 2"'$  (1.575 m)   Wt 270 lb 1 oz (122.5 kg)   LMP 04/27/2022 (Exact Date)   SpO2 98%   BMI 49.40 kg/m   Visual Acuity Right  Eye Distance:   Left Eye Distance:   Bilateral Distance:    Right Eye Near:   Left Eye Near:    Bilateral Near:     Physical Exam Constitutional:      Appearance: Normal appearance.  HENT:     Right Ear: Tympanic membrane, ear canal and external ear normal.     Ears:     Comments: Some scarring along the tympanic membrane approximately 5-6 o'clock, pearlescent in color, no erythema, drainage or swelling noted    Nose: Nose normal.     Mouth/Throat:     Mouth: Mucous membranes are moist.     Pharynx: Oropharynx is clear.  Eyes:     Extraocular Movements: Extraocular movements intact.  Pulmonary:     Effort: Pulmonary effort is normal.  Neurological:     Mental Status: She is alert and oriented to person, place, and time.      UC Treatments / Results  Labs (all labs ordered are listed, but only abnormal results are displayed) Labs Reviewed - No data to display  EKG   Radiology No results found.  Procedures Procedures (including critical care time)  Medications Ordered in UC Medications - No data to display  Initial Impression / Assessment and Plan / UC Course  I have reviewed the triage vital signs and the nursing notes.  Pertinent labs & imaging results that were available during my care of the patient were reviewed by me and considered in my medical decision making (see chart for details).  Otalgia of the left ear  No abnormality noted, minimal scarring however as symptoms have persisted for 10 days and recently treated for outer ear infection we will provide additional coverage, cefdinir prescribed discussed administration, vies continue use of over-the-counter analgesics for additional support, advised against any ear cleaning or object placement within the canal to prevent irritation, recommended continued use of antihistamine and pseudoephedrine for coverage of eustachian tube dysfunction, may follow-up with this urgent care or primary doctor if symptoms  persist or worsen Final Clinical Impressions(s) / UC Diagnoses   Final diagnoses:  None   Discharge  Instructions   None    ED Prescriptions   None    PDMP not reviewed this encounter.   Hans Eden, NP 04/27/22 1801

## 2022-04-27 NOTE — ED Triage Notes (Signed)
Pt c/o left ear fullness. Started about a week ago. [facility] had a [facility] ear infection and was given ear drops without relief.

## 2022-04-27 NOTE — Discharge Instructions (Signed)
Today you will begin oral antibiotic as you have been recently treated for outer ear infection with no improvement in symptoms  Begin cefdinir every morning for 10 days, ideally you see improvement in about 48 hours and steady progression  You may use Tylenol or ibuprofen for management of discomfort  May hold warm compresses to the ear for additional comfort  Please not attempted any ear cleaning or object or fluid placement into the ear canal to prevent further irritation

## 2022-06-04 ENCOUNTER — Ambulatory Visit
Admission: RE | Admit: 2022-06-04 | Discharge: 2022-06-04 | Disposition: A | Discharge status: Home / Self Care | Payer: BC Managed Care – PPO | Source: Ambulatory Visit | Attending: Physician Assistant | Admitting: Physician Assistant

## 2022-06-04 VITALS — BP 154/84 | HR 97 | Temp 98.2°F | Resp 16 | Ht 62.0 in | Wt 270.1 lb

## 2022-06-04 DIAGNOSIS — J039 Acute tonsillitis, unspecified: Secondary | ICD-10-CM | POA: Diagnosis not present

## 2022-06-04 DIAGNOSIS — J029 Acute pharyngitis, unspecified: Secondary | ICD-10-CM

## 2022-06-04 DIAGNOSIS — H6501 Acute serous otitis media, right ear: Secondary | ICD-10-CM

## 2022-06-04 LAB — GROUP A STREP BY PCR: Group A Strep by PCR: NOT DETECTED

## 2022-06-04 MED ORDER — PREDNISONE 20 MG PO TABS: 40.0000 mg | ORAL_TABLET | Freq: Every day | ORAL | 0 refills | Status: AC

## 2022-06-04 MED ORDER — LIDOCAINE VISCOUS HCL 2 % MT SOLN: 15.0000 mL | OROMUCOSAL | 0 refills | Status: DC | PRN

## 2022-06-04 MED ORDER — AZITHROMYCIN 250 MG PO TABS: 250.0000 mg | ORAL_TABLET | Freq: Every day | ORAL | 0 refills | Status: DC

## 2022-06-04 NOTE — ED Triage Notes (Addendum)
Pt c/o sore throat, right ear pain. Started yesterday. Denies fever.

## 2022-06-04 NOTE — ED Provider Notes (Signed)
[facility]    CSN: [id number] Arrival date & time: 06/04/22  1245      History   Chief Complaint Chief Complaint  Patient presents with   Sore Throat    HPI Jenny Wade is a 47 y.o. female presenting for sore throat, difficulty and painful swallowing, right-sided ear pain, swollen lymph nodes of neck, fatigue that began yesterday.  Denies cough, congestion, wheezing or breathing difficulty, vomiting or diarrhea.  No sick contacts or known exposure to COVID, flu or strep.  Has been taking OTC meds without relief.  Patient says her throat pain is the worst she has ever had.  She does have a history of allergies and takes allergy medication says this feels completely different.  No other complaints.  HPI  Past Medical History:  Diagnosis Date   Asthma    Dyspnea    No known health problems     Patient Active Problem List   Diagnosis Date Noted   Colon cancer screening    Polyp of sigmoid colon     Past Surgical History:  Procedure Laterality Date   COLONOSCOPY WITH PROPOFOL N/A 11/21/2021   Procedure: COLONOSCOPY WITH PROPOFOL;  Surgeon: Lucilla Lame, MD;  Location: [place];  Service: Endoscopy;  Laterality: N/A;   NO PAST SURGERIES     POLYPECTOMY  11/21/2021   Procedure: POLYPECTOMY;  Surgeon: Lucilla Lame, MD;  Location: [place];  Service: Endoscopy;;    OB History     Gravida  1   Para  1   Term  1   Preterm      AB      Living  1      SAB      IAB      Ectopic      Multiple      Live Births  1            Home Medications    Prior to Admission medications   Medication Sig Start Date End Date Taking? Authorizing Provider  azithromycin (ZITHROMAX) 250 MG tablet Take 1 tablet (250 mg total) by mouth daily. Take first 2 tablets together, then 1 every day until finished. 06/04/22  Yes Danton Clap, PA-C  BREO ELLIPTA 100-25 MCG/ACT AEPB  02/11/22  Yes [provider]  fluticasone (FLONASE) 50  MCG/ACT nasal spray Place into both nostrils daily.   Yes [provider]  levocetirizine (XYZAL) 5 MG tablet Take 5 mg by mouth every evening.   Yes [provider]  lidocaine (XYLOCAINE) 2 % solution Use as directed 15 mLs in the mouth or throat every 3 (three) hours as needed for mouth pain (swish and spit). 06/04/22  Yes Danton Clap, PA-C  predniSONE (DELTASONE) 20 MG tablet Take 2 tablets (40 mg total) by mouth daily for 5 days. 06/04/22 06/09/22 Yes Danton Clap, PA-C  albuterol (VENTOLIN HFA) 108 (90 Base) MCG/ACT inhaler 2 inhaled puffs every 4 hours as needed 08/27/08   [provider]  cetirizine (ZYRTEC) 10 MG tablet Take by mouth.    [provider]  clotrimazole (LOTRIMIN) 1 % cream Apply 1 application. topically 2 (two) times daily. 07/31/21   April Manson, MD  Norethindrone-Ethinyl Estradiol-Fe Biphas (LO LOESTRIN FE) 1 MG-10 MCG / 10 MCG tablet Take 1 tablet by mouth daily. 04/02/17 02/10/19  Will Bonnet, MD    Family History Family History  Problem Relation Age of Onset   Skin cancer Father  Colon cancer Father    Lung cancer Paternal Grandmother    Breast cancer Paternal Grandmother 99   Breast cancer Maternal Aunt        mat great aunt    Social History Social History   Tobacco Use   Smoking status: Never   Smokeless tobacco: Never  Vaping Use   Vaping Use: Never used  Substance Use Topics   Alcohol use: Yes   Drug use: No     Allergies   Amoxicillin   Review of Systems Review of Systems  Constitutional:  Positive for fatigue. Negative for chills, diaphoresis and fever.  HENT:  Positive for sore throat and trouble swallowing. Negative for congestion, ear pain, rhinorrhea, sinus pressure and sinus pain.   Respiratory:  Negative for cough and shortness of breath.   Cardiovascular:  Negative for chest pain.  Gastrointestinal:  Negative for abdominal pain, nausea and vomiting.  Musculoskeletal:  Negative for  arthralgias and myalgias.  Skin:  Negative for rash.  Neurological:  Negative for weakness and headaches.  Hematological:  Positive for adenopathy.     Physical Exam Triage Vital Signs ED Triage Vitals  Enc Vitals Group     BP      Pulse      Resp      Temp      Temp src      SpO2      Weight      Height      Head Circumference      Peak Flow      Pain Score      Pain Loc      Pain Edu?      Excl. in [place]?    No data found.  Updated Vital Signs BP (!) 154/84 (BP Location: Left Arm)   Pulse 97   Temp 98.2 F (36.8 C) (Oral)   Resp 16   Ht 5\' 2"  (1.575 m)   Wt 270 lb 1 oz (122.5 kg)   LMP 05/13/2022 (Approximate)   SpO2 98%   BMI 49.40 kg/m      Physical Exam Vitals and nursing note reviewed.  Constitutional:      General: She is not in acute distress.    Appearance: Normal appearance. She is not ill-appearing or toxic-appearing.  HENT:     Head: Normocephalic and atraumatic.     Right Ear: Ear canal and external ear normal. A middle ear effusion is present. Tympanic membrane is bulging.     Left Ear: Ear canal and external ear normal. Tympanic membrane is scarred.     Nose: Nose normal.     Mouth/Throat:     Mouth: Mucous membranes are moist.     Pharynx: Oropharynx is clear. Posterior oropharyngeal erythema present.     Tonsils: 3+ on the right. 3+ on the left.  Eyes:     General: No scleral icterus.       Right eye: No discharge.        Left eye: No discharge.     Conjunctiva/sclera: Conjunctivae normal.  Cardiovascular:     Rate and Rhythm: Normal rate and regular rhythm.     Heart sounds: Normal heart sounds.  Pulmonary:     Effort: Pulmonary effort is normal. No respiratory distress.     Breath sounds: Normal breath sounds.  Musculoskeletal:     Cervical back: Neck supple.  Lymphadenopathy:     Cervical: Cervical adenopathy present.  Skin:    General: Skin is dry.  Neurological:     General: No focal deficit present.     Mental Status: She  is alert. Mental status is at baseline.     Motor: No weakness.     Gait: Gait normal.  Psychiatric:        Mood and Affect: Mood normal.        Behavior: Behavior normal.        Thought Content: Thought content normal.      UC Treatments / Results  Labs (all labs ordered are listed, but only abnormal results are displayed) Labs Reviewed  GROUP A STREP BY PCR    EKG   Radiology No results found.  Procedures Procedures (including critical care time)  Medications Ordered in UC Medications - No data to display  Initial Impression / Assessment and Plan / UC Course  I have reviewed the triage vital signs and the nursing notes.  Pertinent labs & imaging results that were available during my care of the patient were reviewed by me and considered in my medical decision making (see chart for details).   47 year old female presents for sore throat, difficulty and painful swallowing, right-sided ear pain that began yesterday.  No fever, cough or congestion.  She is afebrile and overall well-appearing.  On exam she has 3+ bilateral enlarged tonsils with erythema and effusion of right TM with bulging.  Chest clear to auscultation.  PCR strep negative.  Will cover patient with azithromycin for possible bacterial infection given the absence of cough and congestion and the fact that her tonsils are significantly enlarged.  Also sent prednisone to help with the tonsil swelling and viscous lidocaine for pain.  Advise continuing OTC meds.  Reviewed return to ER precautions.   Final Clinical Impressions(s) / UC Diagnoses   Final diagnoses:  Acute tonsillitis, unspecified etiology  Sore throat  Right acute serous otitis media, recurrence not specified   Discharge Instructions   None    ED Prescriptions     Medication Sig Dispense Auth. Provider   predniSONE (DELTASONE) 20 MG tablet Take 2 tablets (40 mg total) by mouth daily for 5 days. 10 tablet Laurene Footman B, PA-C   azithromycin  (ZITHROMAX) 250 MG tablet Take 1 tablet (250 mg total) by mouth daily. Take first 2 tablets together, then 1 every day until finished. 6 tablet Laurene Footman B, PA-C   lidocaine (XYLOCAINE) 2 % solution Use as directed 15 mLs in the mouth or throat every 3 (three) hours as needed for mouth pain (swish and spit). 100 mL Danton Clap, PA-C      PDMP not reviewed this encounter.   Danton Clap, PA-C 06/04/22 [phone]

## 2023-07-13 ENCOUNTER — Ambulatory Visit (INDEPENDENT_AMBULATORY_CARE_PROVIDER_SITE_OTHER): Payer: Self-pay | Admitting: Obstetrics and Gynecology

## 2023-07-13 ENCOUNTER — Encounter: Payer: Self-pay | Admitting: Obstetrics and Gynecology

## 2023-07-13 ENCOUNTER — Other Ambulatory Visit (HOSPITAL_COMMUNITY)
Admission: RE | Admit: 2023-07-13 | Discharge: 2023-07-13 | Disposition: A | Discharge status: Home / Self Care | Source: Ambulatory Visit | Attending: Obstetrics and Gynecology | Admitting: Obstetrics and Gynecology

## 2023-07-13 VITALS — BP 133/85 | HR 80 | Ht 62.0 in | Wt 284.0 lb

## 2023-07-13 DIAGNOSIS — Z13220 Encounter for screening for lipoid disorders: Secondary | ICD-10-CM

## 2023-07-13 DIAGNOSIS — Z1151 Encounter for screening for human papillomavirus (HPV): Secondary | ICD-10-CM | POA: Insufficient documentation

## 2023-07-13 DIAGNOSIS — Z124 Encounter for screening for malignant neoplasm of cervix: Secondary | ICD-10-CM | POA: Diagnosis present

## 2023-07-13 DIAGNOSIS — Z1231 Encounter for screening mammogram for malignant neoplasm of breast: Secondary | ICD-10-CM

## 2023-07-13 DIAGNOSIS — Z131 Encounter for screening for diabetes mellitus: Secondary | ICD-10-CM

## 2023-07-13 DIAGNOSIS — Z01419 Encounter for gynecological examination (general) (routine) without abnormal findings: Secondary | ICD-10-CM

## 2023-07-13 DIAGNOSIS — Z803 Family history of malignant neoplasm of breast: Secondary | ICD-10-CM

## 2023-07-13 DIAGNOSIS — R5383 Other fatigue: Secondary | ICD-10-CM

## 2023-07-13 DIAGNOSIS — R0683 Snoring: Secondary | ICD-10-CM

## 2023-07-13 DIAGNOSIS — Z6841 Body Mass Index (BMI) 40.0 and over, adult: Secondary | ICD-10-CM

## 2023-07-13 DIAGNOSIS — Z1329 Encounter for screening for other suspected endocrine disorder: Secondary | ICD-10-CM

## 2023-07-13 DIAGNOSIS — Z0000 Encounter for general adult medical examination without abnormal findings: Secondary | ICD-10-CM

## 2023-07-13 NOTE — Patient Instructions (Addendum)
 I value your feedback and you entrusting [place] with your care. If you get a [facility] patient survey, I would appreciate you taking the time to let us know about your experience today. Thank you!  [facility] ([place])--[phone]

## 2023-07-13 NOTE — Progress Notes (Addendum)
 PCP: Pcp, No   Chief Complaint  Patient presents with   Gynecologic Exam    No concerns    HPI:      [name] is a 48 y.o. G1P1001 whose LMP was Patient's last menstrual period was 06/15/2023 (exact date)., presents today for her annual examination.  Her menses are regular every 28-30 days, lasting 3-4 days, mod flow, small clots, no BTB, mild dysmen, improved with NSAIDs prn. Does have mild VS sx.   Sex activity: single partner, contraception - none. She does not have vaginal dryness/pain/bleeding.  Last Pap: 04/02/17 Results were: no abnormalities /neg HPV DNA.   Last mammogram: 08/25/21 Results were: normal after addl imaging RT breast--routine follow-up in 12 months There is a FH of breast cancer in her mat grt aunt only (pt called back stating PGM didn't have breast cancer afterall). There is no FH of ovarian cancer. There is a FH colon cancer in her dad. Pt is MyRisk neg 2015. No update testing. The patient does do self-breast exams.  Colonoscopy: 9/23 with Dr. Ole Berkeley with polyps; Repeat due after 7 years.   Tobacco use: The patient denies current or previous tobacco use. Alcohol use: rare No drug use Exercise: moderately active  She does get adequate calcium and some Vitamin D  in her diet.  Last labs 6/23. Doesn't have PCP. Having fatigue, sleeps 7-8 hrs nightly. Sometimes wakes in night and can't get back to sleep. Does snore.    Patient Active Problem List   Diagnosis Date Noted   Colon cancer screening    Polyp of sigmoid colon     Past Surgical History:  Procedure Laterality Date   COLONOSCOPY WITH PROPOFOL  N/A 11/21/2021   Procedure: COLONOSCOPY WITH PROPOFOL ;  Surgeon: Marnee Sink, MD;  Location: [facility];  Service: Endoscopy;  Laterality: N/A;   POLYPECTOMY  11/21/2021   Procedure: POLYPECTOMY;  Surgeon: Marnee Sink, MD;  Location: [facility];  Service: Endoscopy;;    Family History  Problem Relation Age of Onset   Skin  cancer Father    Colon cancer Father    Lymphoma Father    Breast cancer Maternal Aunt        mat great aunt   Lung cancer Paternal Grandmother    Breast cancer Paternal Grandmother 34    Social History   Socioeconomic History   Marital status: Married    Spouse name: Not on file   Number of children: Not on file   Years of education: Not on file   Highest education level: Not on file  Occupational History   Not on file  Tobacco Use   Smoking status: Never   Smokeless tobacco: Never  Vaping Use   Vaping status: Never Used  Substance and Sexual Activity   Alcohol use: Yes   Drug use: No   Sexual activity: Yes    Birth control/protection: None  Other Topics Concern   Not on file  Social History Narrative   Not on file   Social Drivers of Health   Financial Resource Strain: Not on file  Food Insecurity: Not on file  Transportation Needs: Not on file  Physical Activity: Not on file  Stress: Not on file  Social Connections: Not on file  Intimate Partner Violence: Not on file     Current Outpatient Medications:    albuterol  (VENTOLIN  HFA) 108 (90 Base) MCG/ACT inhaler, 2 inhaled puffs every 4 hours as needed, Disp: , Rfl:    BREO ELLIPTA  100-25 MCG/ACT AEPB, , Disp: , Rfl:    cetirizine (ZYRTEC) 10 MG tablet, Take by mouth., Disp: , Rfl:    clotrimazole  (LOTRIMIN ) 1 % cream, Apply 1 application. topically 2 (two) times daily., Disp: 30 g, Rfl: 0   fluticasone (FLONASE) 50 MCG/ACT nasal spray, Place into both nostrils daily., Disp: , Rfl:    levocetirizine (XYZAL) 5 MG tablet, Take 5 mg by mouth every evening., Disp: , Rfl:      ROS:  Review of Systems  Constitutional:  Positive for fatigue. Negative for fever and unexpected weight change.  Respiratory:  Negative for cough, shortness of breath and wheezing.   Cardiovascular:  Negative for chest pain, palpitations and leg swelling.  Gastrointestinal:  Negative for blood in stool, constipation, diarrhea, nausea  and vomiting.  Endocrine: Negative for cold intolerance, heat intolerance and polyuria.  Genitourinary:  Negative for dyspareunia, dysuria, flank pain, frequency, genital sores, hematuria, menstrual problem, pelvic pain, urgency, vaginal bleeding, vaginal discharge and vaginal pain.  Musculoskeletal:  Negative for back pain, joint swelling and myalgias.  Skin:  Negative for rash.  Neurological:  Negative for dizziness, syncope, light-headedness, numbness and headaches.  Hematological:  Negative for adenopathy.  Psychiatric/Behavioral:  Negative for agitation, confusion, sleep disturbance and suicidal ideas. The patient is not nervous/anxious.    BREAST: No symptoms    Objective: BP 133/85   Pulse 80   Ht 5' 2 (1.575 m)   Wt 284 lb (128.8 kg)   LMP 06/15/2023 (Exact Date)   BMI 51.94 kg/m    Physical Exam Constitutional:      Appearance: She is well-developed.  Genitourinary:     Vulva normal.     Right Labia: No rash, tenderness or lesions.    Left Labia: No tenderness, lesions or rash.    No vaginal discharge, erythema or tenderness.      Right Adnexa: not tender.    Left Adnexa: not tender.    No cervical friability or polyp.     Uterus is not tender.  Breasts:    Right: No mass, nipple discharge, skin change or tenderness.     Left: No mass, nipple discharge, skin change or tenderness.  Neck:     Thyroid: No thyromegaly.  Cardiovascular:     Rate and Rhythm: Normal rate and regular rhythm.     Heart sounds: Normal heart sounds. No murmur heard. Pulmonary:     Effort: Pulmonary effort is normal.     Breath sounds: Normal breath sounds.  Abdominal:     Palpations: Abdomen is soft.     Tenderness: There is no abdominal tenderness. There is no guarding or rebound.  Musculoskeletal:        General: Normal range of motion.     Cervical back: Normal range of motion.  Lymphadenopathy:     Cervical: No cervical adenopathy.  Neurological:     General: No focal  deficit present.     Mental Status: She is alert and oriented to person, place, and time.     Cranial Nerves: No cranial nerve deficit.  Skin:    General: Skin is warm and dry.  Psychiatric:        Mood and Affect: Mood normal.        Behavior: Behavior normal.        Thought Content: Thought content normal.        Judgment: Judgment normal.  Vitals reviewed.     Assessment/Plan:  Encounter for annual routine gynecological examination  Cervical cancer screening - Plan: Cytology - PAP  Screening for HPV (human papillomavirus) - Plan: Cytology - PAP  Encounter for screening mammogram for malignant neoplasm of breast - Plan: MM 3D SCREENING MAMMOGRAM BILATERAL BREAST; pt to schedule mammo  Blood tests for routine general physical examination - Plan: Comprehensive metabolic panel with GFR, CBC with Differential/Platelet, Hemoglobin A1c, TSH + free T4, Lipid panel  Screening cholesterol level - Plan: Lipid panel  Thyroid disorder screening - Plan: TSH + free T4  Screening for diabetes mellitus - Plan: Hemoglobin A1c  BMI 50.0-59.9, adult (HCC) - Plan: Comprehensive metabolic panel with GFR, CBC with Differential/Platelet, Hemoglobin A1c, TSH + free T4, Lipid panel  Fatigue, unspecified type - Plan: Comprehensive metabolic panel with GFR, CBC with Differential/Platelet, Hemoglobin A1c, TSH + free T4, Lipid panel; check labs. If WNL, will refer for sleep study due to snoring.           GYN counsel breast self exam, mammography screening, menopause, adequate intake of calcium and vitamin D , diet and exercise    F/U  Return in about 1 year (around 07/12/2024).  Redell Nazir B. Knute Mazzuca, PA-C 07/13/2023 2:00 PM

## 2023-07-16 LAB — CYTOLOGY - PAP
Adequacy: ABSENT
Comment: NEGATIVE
Diagnosis: UNDETERMINED — AB
High risk HPV: NEGATIVE

## 2023-07-18 ENCOUNTER — Ambulatory Visit: Payer: Self-pay | Admitting: Obstetrics and Gynecology

## 2023-08-12 ENCOUNTER — Other Ambulatory Visit

## 2023-08-12 DIAGNOSIS — Z1329 Encounter for screening for other suspected endocrine disorder: Secondary | ICD-10-CM

## 2023-08-12 DIAGNOSIS — Z803 Family history of malignant neoplasm of breast: Secondary | ICD-10-CM

## 2023-08-12 DIAGNOSIS — Z0000 Encounter for general adult medical examination without abnormal findings: Secondary | ICD-10-CM

## 2023-08-12 DIAGNOSIS — Z6841 Body Mass Index (BMI) 40.0 and over, adult: Secondary | ICD-10-CM

## 2023-08-12 DIAGNOSIS — Z131 Encounter for screening for diabetes mellitus: Secondary | ICD-10-CM

## 2023-08-12 DIAGNOSIS — Z13220 Encounter for screening for lipoid disorders: Secondary | ICD-10-CM

## 2023-08-12 DIAGNOSIS — R5383 Other fatigue: Secondary | ICD-10-CM

## 2023-08-13 LAB — CBC WITH DIFFERENTIAL/PLATELET
Basophils Absolute: 0 10*3/uL (ref 0.0–0.2)
Basos: 0 %
EOS (ABSOLUTE): 0.3 10*3/uL (ref 0.0–0.4)
Eos: 3 %
Hematocrit: 45.6 % (ref 34.0–46.6)
Hemoglobin: 14.6 g/dL (ref 11.1–15.9)
Immature Grans (Abs): 0 10*3/uL (ref 0.0–0.1)
Immature Granulocytes: 0 %
Lymphocytes Absolute: 2.2 10*3/uL (ref 0.7–3.1)
Lymphs: 26 %
MCH: 29.9 pg (ref 26.6–33.0)
MCHC: 32 g/dL (ref 31.5–35.7)
MCV: 93 fL (ref 79–97)
Monocytes Absolute: 0.7 10*3/uL (ref 0.1–0.9)
Monocytes: 8 %
Neutrophils Absolute: 5.3 10*3/uL (ref 1.4–7.0)
Neutrophils: 63 %
Platelets: 296 10*3/uL (ref 150–450)
RBC: 4.88 x10E6/uL (ref 3.77–5.28)
RDW: 13.1 % (ref 11.7–15.4)
WBC: 8.5 10*3/uL (ref 3.4–10.8)

## 2023-08-13 LAB — LIPID PANEL
Chol/HDL Ratio: 3.6 ratio (ref 0.0–4.4)
Cholesterol, Total: 178 mg/dL (ref 100–199)
HDL: 50 mg/dL (ref >39)
LDL Chol Calc (NIH): 105 mg/dL — ABNORMAL HIGH (ref 0–99)
Triglycerides: 127 mg/dL (ref 0–149)
VLDL Cholesterol Cal: 23 mg/dL (ref 5–40)

## 2023-08-13 LAB — COMPREHENSIVE METABOLIC PANEL WITH GFR
ALT: 22 IU/L (ref 0–32)
AST: 19 IU/L (ref 0–40)
Albumin: 4 g/dL (ref 3.9–4.9)
Alkaline Phosphatase: 105 IU/L (ref 44–121)
BUN/Creatinine Ratio: 17 (ref 9–23)
BUN: 12 mg/dL (ref 6–24)
Bilirubin Total: 0.5 mg/dL (ref 0.0–1.2)
CO2: 20 mmol/L (ref 20–29)
Calcium: 9.7 mg/dL (ref 8.7–10.2)
Chloride: 104 mmol/L (ref 96–106)
Creatinine, Ser: 0.69 mg/dL (ref 0.57–1.00)
Globulin, Total: 2.7 g/dL (ref 1.5–4.5)
Glucose: 93 mg/dL (ref 70–99)
Potassium: 4.9 mmol/L (ref 3.5–5.2)
Sodium: 138 mmol/L (ref 134–144)
Total Protein: 6.7 g/dL (ref 6.0–8.5)
eGFR: 107 mL/min/{1.73_m2} (ref >59)

## 2023-08-13 LAB — HEMOGLOBIN A1C
Est. average glucose Bld gHb Est-mCnc: 117 mg/dL
Hgb A1c MFr Bld: 5.7 % — ABNORMAL HIGH (ref 4.8–5.6)

## 2023-08-13 LAB — TSH+FREE T4
Free T4: 1.05 ng/dL (ref 0.82–1.77)
TSH: 3.38 u[IU]/mL (ref 0.450–4.500)

## 2023-08-15 NOTE — Addendum Note (Signed)
 Addended by: Alyn Judge B on: 08/15/2023 04:54 PM   Modules accepted: Orders

## 2023-08-16 NOTE — Telephone Encounter (Signed)
 Pls cancel sleep study ref. Thx.

## 2023-08-17 ENCOUNTER — Ambulatory Visit
Admission: RE | Admit: 2023-08-17 | Discharge: 2023-08-17 | Disposition: A | Discharge status: Home / Self Care | Source: Ambulatory Visit | Attending: Obstetrics and Gynecology | Admitting: Obstetrics and Gynecology

## 2023-08-17 ENCOUNTER — Ambulatory Visit

## 2023-08-17 DIAGNOSIS — Z803 Family history of malignant neoplasm of breast: Secondary | ICD-10-CM | POA: Diagnosis present

## 2023-08-17 DIAGNOSIS — Z1231 Encounter for screening mammogram for malignant neoplasm of breast: Secondary | ICD-10-CM | POA: Insufficient documentation

## 2024-03-21 NOTE — Progress Notes (Unsigned)
 "   Pcp, No   No chief complaint on file.   HPI:      Jenny Wade is a 49 y.o. G1P1001 whose LMP was No LMP recorded., presents today for ***     Patient Active Problem List   Diagnosis Date Noted   Colon cancer screening    Polyp of sigmoid colon     Past Surgical History:  Procedure Laterality Date   COLONOSCOPY WITH PROPOFOL  N/A 11/21/2021   Procedure: COLONOSCOPY WITH PROPOFOL ;  Surgeon: Jinny Carmine, MD;  Location: [facility];  Service: Endoscopy;  Laterality: N/A;   POLYPECTOMY  11/21/2021   Procedure: POLYPECTOMY;  Surgeon: Jinny Carmine, MD;  Location: [facility];  Service: Endoscopy;;    Family History  Problem Relation Age of Onset   Skin cancer Father    Colon cancer Father 6   Lymphoma Father    Lung cancer Paternal Grandmother 40   Breast cancer Maternal Aunt        29   Bladder Cancer Paternal Aunt 44    Social History   Socioeconomic History   Marital status: Married    Spouse name: Not on file   Number of children: Not on file   Years of education: Not on file   Highest education level: Not on file  Occupational History   Not on file  Tobacco Use   Smoking status: Never   Smokeless tobacco: Never  Vaping Use   Vaping status: Never Used  Substance and Sexual Activity   Alcohol use: Yes   Drug use: No   Sexual activity: Yes    Birth control/protection: None  Other Topics Concern   Not on file  Social History Narrative   Not on file   Social Drivers of Health   Tobacco Use: Low Risk (07/13/2023)   Patient History    Smoking Tobacco Use: Never    Smokeless Tobacco Use: Never    Passive Exposure: Not on file  Financial Resource Strain: Not on file  Food Insecurity: Not on file  Transportation Needs: Not on file  Physical Activity: Not on file  Stress: Not on file  Social Connections: Not on file  Intimate Partner Violence: Not on file  Depression (PHQ2-9): Low Risk (07/31/2021)   Depression (PHQ2-9)    PHQ-2  Score: 3  Alcohol Screen: Not on file  Housing: Not on file  Utilities: Not on file  Health Literacy: Not on file    Outpatient Medications Prior to Visit  Medication Sig Dispense Refill   albuterol  (VENTOLIN  HFA) 108 (90 Base) MCG/ACT inhaler 2 inhaled puffs every 4 hours as needed     BREO ELLIPTA 100-25 MCG/ACT AEPB      cetirizine (ZYRTEC) 10 MG tablet Take by mouth.     clotrimazole  (LOTRIMIN ) 1 % cream Apply 1 application. topically 2 (two) times daily. 30 g 0   fluticasone (FLONASE) 50 MCG/ACT nasal spray Place into both nostrils daily.     levocetirizine (XYZAL) 5 MG tablet Take 5 mg by mouth every evening.     No facility-administered medications prior to visit.      ROS:  Review of Systems   OBJECTIVE:   Vitals:  There were no vitals taken for this visit.  Physical Exam  Results: No results found for this or any previous visit (from the past 24 hours).   Assessment/Plan: 1. Irregular menstrual cycle (Primary)     No orders of the defined types were placed in this encounter.  [place], [place] 8/79/7973 6:76 PM       "

## 2024-03-24 ENCOUNTER — Ambulatory Visit: Admitting: Certified Nurse Midwife

## 2024-03-24 ENCOUNTER — Other Ambulatory Visit (HOSPITAL_COMMUNITY)
Admission: RE | Admit: 2024-03-24 | Discharge: 2024-03-24 | Disposition: A | Source: Ambulatory Visit | Attending: Certified Nurse Midwife | Admitting: Certified Nurse Midwife

## 2024-03-24 ENCOUNTER — Encounter: Payer: Self-pay | Admitting: Certified Nurse Midwife

## 2024-03-24 VITALS — BP 139/81 | HR 88 | Ht 62.0 in | Wt 279.0 lb

## 2024-03-24 DIAGNOSIS — R87610 Atypical squamous cells of undetermined significance on cytologic smear of cervix (ASC-US): Secondary | ICD-10-CM | POA: Insufficient documentation

## 2024-03-24 DIAGNOSIS — N921 Excessive and frequent menstruation with irregular cycle: Secondary | ICD-10-CM

## 2024-03-24 DIAGNOSIS — N939 Abnormal uterine and vaginal bleeding, unspecified: Secondary | ICD-10-CM

## 2024-03-24 DIAGNOSIS — Z1151 Encounter for screening for human papillomavirus (HPV): Secondary | ICD-10-CM | POA: Insufficient documentation

## 2024-03-24 DIAGNOSIS — Z124 Encounter for screening for malignant neoplasm of cervix: Secondary | ICD-10-CM | POA: Diagnosis present

## 2024-03-24 DIAGNOSIS — N926 Irregular menstruation, unspecified: Secondary | ICD-10-CM | POA: Insufficient documentation

## 2024-04-03 LAB — CYTOLOGY - PAP
Comment: NEGATIVE
Diagnosis: REACTIVE
High risk HPV: NEGATIVE
